# Patient Record
Sex: Male | Born: 1950 | Race: White | Hispanic: No | Marital: Married | Smoking: Never smoker
Health system: Southern US, Community
[De-identification: ages and names within clinical notes are randomized; demographics above are authoritative.]

## PROBLEM LIST (undated history)

## (undated) DIAGNOSIS — K635 Polyp of colon: Secondary | ICD-10-CM

## (undated) DIAGNOSIS — Z85828 Personal history of other malignant neoplasm of skin: Secondary | ICD-10-CM

## (undated) DIAGNOSIS — E079 Disorder of thyroid, unspecified: Secondary | ICD-10-CM

## (undated) DIAGNOSIS — E785 Hyperlipidemia, unspecified: Secondary | ICD-10-CM

## (undated) DIAGNOSIS — B009 Herpesviral infection, unspecified: Secondary | ICD-10-CM

## (undated) DIAGNOSIS — K5730 Diverticulosis of large intestine without perforation or abscess without bleeding: Secondary | ICD-10-CM

## (undated) DIAGNOSIS — E039 Hypothyroidism, unspecified: Secondary | ICD-10-CM

## (undated) DIAGNOSIS — H269 Unspecified cataract: Secondary | ICD-10-CM

## (undated) DIAGNOSIS — I10 Essential (primary) hypertension: Secondary | ICD-10-CM

## (undated) DIAGNOSIS — C801 Malignant (primary) neoplasm, unspecified: Secondary | ICD-10-CM

## (undated) HISTORY — PX: POLYPECTOMY: SHX149

## (undated) HISTORY — DX: Polyp of colon: K63.5

## (undated) HISTORY — DX: Hyperlipidemia, unspecified: E78.5

## (undated) HISTORY — PX: ACHILLES TENDON REPAIR: SUR1153

## (undated) HISTORY — DX: Personal history of other malignant neoplasm of skin: Z85.828

## (undated) HISTORY — DX: Malignant (primary) neoplasm, unspecified: C80.1

## (undated) HISTORY — PX: COLONOSCOPY: SHX174

## (undated) HISTORY — DX: Essential (primary) hypertension: I10

## (undated) HISTORY — PX: ANTERIOR CRUCIATE LIGAMENT REPAIR: SHX115

## (undated) HISTORY — PX: BASAL CELL CARCINOMA EXCISION: SHX1214

## (undated) HISTORY — PX: OTHER SURGICAL HISTORY: SHX169

## (undated) HISTORY — PX: EYE SURGERY: SHX253

## (undated) HISTORY — DX: Disorder of thyroid, unspecified: E07.9

## (undated) HISTORY — DX: Unspecified cataract: H26.9

---

## 2002-01-28 ENCOUNTER — Ambulatory Visit (HOSPITAL_COMMUNITY): Admission: RE | Admit: 2002-01-28 | Discharge: 2002-01-28 | Payer: Self-pay | Admitting: Internal Medicine

## 2002-01-28 ENCOUNTER — Encounter: Payer: Self-pay | Admitting: Internal Medicine

## 2003-08-19 ENCOUNTER — Ambulatory Visit (HOSPITAL_COMMUNITY): Admission: RE | Admit: 2003-08-19 | Discharge: 2003-08-19 | Payer: Self-pay | Admitting: Internal Medicine

## 2005-04-03 ENCOUNTER — Ambulatory Visit: Payer: Self-pay | Admitting: Gastroenterology

## 2005-04-16 ENCOUNTER — Ambulatory Visit: Payer: Self-pay | Admitting: Gastroenterology

## 2006-03-26 ENCOUNTER — Ambulatory Visit: Payer: Self-pay | Admitting: Family Medicine

## 2006-03-26 LAB — CONVERTED CEMR LAB
ALT: 26 units/L (ref 0–40)
AST: 33 units/L (ref 0–37)
Albumin: 4.1 g/dL (ref 3.5–5.2)
Alkaline Phosphatase: 60 units/L (ref 39–117)
BUN: 16 mg/dL (ref 6–23)
Basophils Absolute: 0 10*3/uL (ref 0.0–0.1)
Basophils Relative: 0.5 % (ref 0.0–1.0)
CO2: 31 meq/L (ref 19–32)
Calcium: 9.2 mg/dL (ref 8.4–10.5)
Chloride: 104 meq/L (ref 96–112)
Cholesterol: 156 mg/dL (ref 0–200)
Creatinine, Ser: 1.1 mg/dL (ref 0.4–1.5)
Eosinophils Absolute: 0.1 10*3/uL (ref 0.0–0.6)
Eosinophils Relative: 1.6 % (ref 0.0–5.0)
GFR calc Af Amer: 89 mL/min
GFR calc non Af Amer: 74 mL/min
Glucose, Bld: 109 mg/dL — ABNORMAL HIGH (ref 70–99)
HCT: 42.1 % (ref 39.0–52.0)
HDL: 59.2 mg/dL (ref >39.0)
Hemoglobin: 14.9 g/dL (ref 13.0–17.0)
Hgb A1c MFr Bld: 5.1 % (ref 4.6–6.0)
LDL Cholesterol: 83 mg/dL (ref 0–99)
Lymphocytes Relative: 33.7 % (ref 12.0–46.0)
MCHC: 35.3 g/dL (ref 30.0–36.0)
MCV: 95.7 fL (ref 78.0–100.0)
Monocytes Absolute: 0.3 10*3/uL (ref 0.2–0.7)
Monocytes Relative: 7.5 % (ref 3.0–11.0)
Neutro Abs: 2.6 10*3/uL (ref 1.4–7.7)
Neutrophils Relative %: 56.7 % (ref 43.0–77.0)
PSA: 1.47 ng/mL (ref 0.10–4.00)
Platelets: 215 10*3/uL (ref 150–400)
Potassium: 4.7 meq/L (ref 3.5–5.1)
RBC: 4.4 M/uL (ref 4.22–5.81)
RDW: 11.7 % (ref 11.5–14.6)
Sodium: 142 meq/L (ref 135–145)
TSH: 4.01 microintl units/mL (ref 0.35–5.50)
Total Bilirubin: 1.5 mg/dL — ABNORMAL HIGH (ref 0.3–1.2)
Total CHOL/HDL Ratio: 2.6
Total Protein: 7 g/dL (ref 6.0–8.3)
Triglycerides: 71 mg/dL (ref 0–149)
VLDL: 14 mg/dL (ref 0–40)
WBC: 4.5 10*3/uL (ref 4.5–10.5)

## 2006-12-06 DIAGNOSIS — Z86010 Personal history of colonic polyps: Secondary | ICD-10-CM

## 2006-12-06 DIAGNOSIS — Z85828 Personal history of other malignant neoplasm of skin: Secondary | ICD-10-CM

## 2006-12-06 DIAGNOSIS — E039 Hypothyroidism, unspecified: Secondary | ICD-10-CM | POA: Insufficient documentation

## 2006-12-06 DIAGNOSIS — I10 Essential (primary) hypertension: Secondary | ICD-10-CM | POA: Insufficient documentation

## 2007-03-28 ENCOUNTER — Ambulatory Visit: Payer: Self-pay | Admitting: Family Medicine

## 2007-03-28 LAB — CONVERTED CEMR LAB
ALT: 25 units/L (ref 0–53)
AST: 26 units/L (ref 0–37)
Albumin: 4.1 g/dL (ref 3.5–5.2)
Alkaline Phosphatase: 57 units/L (ref 39–117)
BUN: 13 mg/dL (ref 6–23)
Basophils Absolute: 0 10*3/uL (ref 0.0–0.1)
Basophils Relative: 0.2 % (ref 0.0–1.0)
Bilirubin Urine: NEGATIVE
Bilirubin, Direct: 0.1 mg/dL (ref 0.0–0.3)
Blood in Urine, dipstick: NEGATIVE
CO2: 30 meq/L (ref 19–32)
Calcium: 9 mg/dL (ref 8.4–10.5)
Chloride: 105 meq/L (ref 96–112)
Cholesterol: 140 mg/dL (ref 0–200)
Creatinine, Ser: 1.1 mg/dL (ref 0.4–1.5)
Eosinophils Absolute: 0.1 10*3/uL (ref 0.0–0.6)
Eosinophils Relative: 1.9 % (ref 0.0–5.0)
GFR calc Af Amer: 89 mL/min
GFR calc non Af Amer: 74 mL/min
Glucose, Bld: 109 mg/dL — ABNORMAL HIGH (ref 70–99)
Glucose, Urine, Semiquant: NEGATIVE
HCT: 41.9 % (ref 39.0–52.0)
HDL: 43.4 mg/dL (ref >39.0)
Hemoglobin: 14.9 g/dL (ref 13.0–17.0)
Ketones, urine, test strip: NEGATIVE
LDL Cholesterol: 87 mg/dL (ref 0–99)
Lymphocytes Relative: 41.4 % (ref 12.0–46.0)
MCHC: 35.5 g/dL (ref 30.0–36.0)
MCV: 95.8 fL (ref 78.0–100.0)
Monocytes Absolute: 0.4 10*3/uL (ref 0.2–0.7)
Monocytes Relative: 6.8 % (ref 3.0–11.0)
Neutro Abs: 2.7 10*3/uL (ref 1.4–7.7)
Neutrophils Relative %: 49.7 % (ref 43.0–77.0)
Nitrite: NEGATIVE
PSA: 1.43 ng/mL (ref 0.10–4.00)
Platelets: 228 10*3/uL (ref 150–400)
Potassium: 4.2 meq/L (ref 3.5–5.1)
Protein, U semiquant: NEGATIVE
RBC: 4.37 M/uL (ref 4.22–5.81)
RDW: 11.6 % (ref 11.5–14.6)
Sodium: 140 meq/L (ref 135–145)
Specific Gravity, Urine: 1.02
TSH: 6.68 microintl units/mL — ABNORMAL HIGH (ref 0.35–5.50)
Total Bilirubin: 0.7 mg/dL (ref 0.3–1.2)
Total CHOL/HDL Ratio: 3.2
Total Protein: 6.3 g/dL (ref 6.0–8.3)
Triglycerides: 46 mg/dL (ref 0–149)
Urobilinogen, UA: 0.2
VLDL: 9 mg/dL (ref 0–40)
WBC Urine, dipstick: NEGATIVE
WBC: 5.5 10*3/uL (ref 4.5–10.5)
pH: 7

## 2007-04-04 ENCOUNTER — Ambulatory Visit: Payer: Self-pay | Admitting: Family Medicine

## 2007-04-04 DIAGNOSIS — E785 Hyperlipidemia, unspecified: Secondary | ICD-10-CM

## 2007-04-04 DIAGNOSIS — M25559 Pain in unspecified hip: Secondary | ICD-10-CM

## 2007-04-08 ENCOUNTER — Ambulatory Visit: Payer: Self-pay | Admitting: Family Medicine

## 2007-04-14 ENCOUNTER — Telehealth: Payer: Self-pay | Admitting: Family Medicine

## 2007-04-15 ENCOUNTER — Telehealth: Payer: Self-pay | Admitting: Family Medicine

## 2008-03-31 ENCOUNTER — Ambulatory Visit: Payer: Self-pay | Admitting: Family Medicine

## 2008-03-31 LAB — CONVERTED CEMR LAB
ALT: 22 units/L (ref 0–53)
AST: 24 units/L (ref 0–37)
Albumin: 4 g/dL (ref 3.5–5.2)
Alkaline Phosphatase: 52 units/L (ref 39–117)
BUN: 15 mg/dL (ref 6–23)
Basophils Absolute: 0 10*3/uL (ref 0.0–0.1)
Basophils Relative: 0 % (ref 0.0–3.0)
Bilirubin Urine: NEGATIVE
Bilirubin, Direct: 0.1 mg/dL (ref 0.0–0.3)
Blood in Urine, dipstick: NEGATIVE
CO2: 30 meq/L (ref 19–32)
Calcium: 9.2 mg/dL (ref 8.4–10.5)
Chloride: 109 meq/L (ref 96–112)
Cholesterol: 149 mg/dL (ref 0–200)
Creatinine, Ser: 1 mg/dL (ref 0.4–1.5)
Eosinophils Absolute: 0.1 10*3/uL (ref 0.0–0.7)
Eosinophils Relative: 1.4 % (ref 0.0–5.0)
GFR calc Af Amer: 99 mL/min
GFR calc non Af Amer: 82 mL/min
Glucose, Bld: 107 mg/dL — ABNORMAL HIGH (ref 70–99)
Glucose, Urine, Semiquant: NEGATIVE
HCT: 42.5 % (ref 39.0–52.0)
HDL: 48.1 mg/dL (ref >39.0)
Hemoglobin: 14.8 g/dL (ref 13.0–17.0)
Ketones, urine, test strip: NEGATIVE
LDL Cholesterol: 87 mg/dL (ref 0–99)
Lymphocytes Relative: 39.2 % (ref 12.0–46.0)
MCHC: 34.8 g/dL (ref 30.0–36.0)
MCV: 96 fL (ref 78.0–100.0)
Monocytes Absolute: 0.3 10*3/uL (ref 0.1–1.0)
Monocytes Relative: 6.3 % (ref 3.0–12.0)
Neutro Abs: 2.8 10*3/uL (ref 1.4–7.7)
Neutrophils Relative %: 53.1 % (ref 43.0–77.0)
Nitrite: NEGATIVE
PSA: 1.41 ng/mL (ref 0.10–4.00)
Platelets: 224 10*3/uL (ref 150–400)
Potassium: 4.8 meq/L (ref 3.5–5.1)
RBC: 4.43 M/uL (ref 4.22–5.81)
RDW: 12.1 % (ref 11.5–14.6)
Sodium: 144 meq/L (ref 135–145)
Specific Gravity, Urine: 1.02
TSH: 3.03 microintl units/mL (ref 0.35–5.50)
Total Bilirubin: 1.1 mg/dL (ref 0.3–1.2)
Total CHOL/HDL Ratio: 3.1
Total Protein: 6.7 g/dL (ref 6.0–8.3)
Triglycerides: 71 mg/dL (ref 0–149)
Urobilinogen, UA: 0.2
VLDL: 14 mg/dL (ref 0–40)
WBC Urine, dipstick: NEGATIVE
WBC: 5.3 10*3/uL (ref 4.5–10.5)
pH: 6

## 2008-04-06 ENCOUNTER — Ambulatory Visit: Payer: Self-pay | Admitting: Family Medicine

## 2008-04-19 ENCOUNTER — Telehealth: Payer: Self-pay | Admitting: *Deleted

## 2009-04-01 ENCOUNTER — Ambulatory Visit: Payer: Self-pay | Admitting: Family Medicine

## 2009-04-01 LAB — CONVERTED CEMR LAB
ALT: 20 units/L (ref 0–53)
AST: 23 units/L (ref 0–37)
Albumin: 4.1 g/dL (ref 3.5–5.2)
Alkaline Phosphatase: 54 units/L (ref 39–117)
BUN: 15 mg/dL (ref 6–23)
Basophils Absolute: 0 10*3/uL (ref 0.0–0.1)
Basophils Relative: 0.6 % (ref 0.0–3.0)
Bilirubin Urine: NEGATIVE
Bilirubin, Direct: 0.2 mg/dL (ref 0.0–0.3)
CO2: 31 meq/L (ref 19–32)
Calcium: 9.1 mg/dL (ref 8.4–10.5)
Chloride: 106 meq/L (ref 96–112)
Cholesterol: 152 mg/dL (ref 0–200)
Creatinine, Ser: 1 mg/dL (ref 0.4–1.5)
Eosinophils Absolute: 0.1 10*3/uL (ref 0.0–0.7)
Eosinophils Relative: 1.9 % (ref 0.0–5.0)
GFR calc non Af Amer: 81.5 mL/min (ref >60)
Glucose, Bld: 102 mg/dL — ABNORMAL HIGH (ref 70–99)
HCT: 43.1 % (ref 39.0–52.0)
HDL: 52 mg/dL (ref >39.00)
Hemoglobin, Urine: NEGATIVE
Hemoglobin: 14.3 g/dL (ref 13.0–17.0)
Ketones, ur: NEGATIVE mg/dL
LDL Cholesterol: 83 mg/dL (ref 0–99)
Leukocytes, UA: NEGATIVE
Lymphocytes Relative: 43.8 % (ref 12.0–46.0)
Lymphs Abs: 2.1 10*3/uL (ref 0.7–4.0)
MCHC: 33.2 g/dL (ref 30.0–36.0)
MCV: 99.6 fL (ref 78.0–100.0)
Monocytes Absolute: 0.3 10*3/uL (ref 0.1–1.0)
Monocytes Relative: 6.5 % (ref 3.0–12.0)
Neutro Abs: 2.4 10*3/uL (ref 1.4–7.7)
Neutrophils Relative %: 47.2 % (ref 43.0–77.0)
Nitrite: NEGATIVE
PSA: 1.91 ng/mL (ref 0.10–4.00)
Platelets: 219 10*3/uL (ref 150.0–400.0)
Potassium: 5.1 meq/L (ref 3.5–5.1)
RBC: 4.33 M/uL (ref 4.22–5.81)
RDW: 11.8 % (ref 11.5–14.6)
Sodium: 141 meq/L (ref 135–145)
Specific Gravity, Urine: <=1.005 (ref 1.000–1.030)
TSH: 5.07 microintl units/mL (ref 0.35–5.50)
Total Bilirubin: 0.9 mg/dL (ref 0.3–1.2)
Total CHOL/HDL Ratio: 3
Total Protein, Urine: NEGATIVE mg/dL
Total Protein: 6.8 g/dL (ref 6.0–8.3)
Triglycerides: 84 mg/dL (ref 0.0–149.0)
Urine Glucose: NEGATIVE mg/dL
Urobilinogen, UA: 0.2 (ref 0.0–1.0)
VLDL: 16.8 mg/dL (ref 0.0–40.0)
WBC: 4.9 10*3/uL (ref 4.5–10.5)
pH: 6.5 (ref 5.0–8.0)

## 2009-04-14 ENCOUNTER — Ambulatory Visit: Payer: Self-pay | Admitting: Family Medicine

## 2009-04-14 ENCOUNTER — Telehealth: Payer: Self-pay | Admitting: Family Medicine

## 2010-04-04 NOTE — Progress Notes (Signed)
Summary: ? for the nurse  Phone Note Call from Patient Call back at Work Phone [phone]   Caller: Patient---live call Reason for Call: Talk to Nurse Summary of Call: Was seen this morning has ? for the nurse. Initial call taken by: Warnell Forester,  April 14, 2009 1:05 PM  Follow-up for Phone Call        left message on machine for patient to return our call Follow-up by: Kern Reap CMA Duncan Dull),  April 14, 2009 4:07 PM  Additional Follow-up for Phone Call Additional follow up Details #1::        Phone Call Completed Additional Follow-up by: Kern Reap CMA Duncan Dull),  April 14, 2009 4:13 PM

## 2010-04-04 NOTE — Assessment & Plan Note (Signed)
Summary: cpx/cjr   Vital Signs:  Patient profile:   60 year old male Height:      69.5 inches Weight:      200 pounds BMI:     29.22 Temp:     98.7 degrees F oral  Vitals Entered By: Kern Reap CMA Duncan Dull) (April 14, 2009 9:47 AM)  Reason for Visit cpx  History of Present Illness:  Alexander Griffith is a 60 year old, married man nonsmoker, who comes in today for evaluation of hypertension, hyperlipidemia, hypothyroidism, and occasional oral HSV.  His hypertension is treated with Benicar 20 mg daily will switch to Cozaar.  His hyperlipidemia history with Zocor 80 mg nightly lipids are goal with a total of 152, HDL 52, LDL 83.  His hypothyroidism is treated with Synthroid 100 micrograms daily TSH level 5.07 continue above dose.  He is an occasional outbreak of cold sores for which he takes acyclovir 400 mg t.i.d. p.r.n..  He gets routine eye care doctor.  Colonoscopy normal in GI, tetanus, 2010, seasonal flu 2010.  Allergies (verified): No Known Drug Allergies  Past History:  Past medical, surgical, family and social histories (including risk factors) reviewed, and no changes noted (except as noted below).  Past Medical History: Reviewed history from 12/06/2006 and no changes required. High Cholesterol Hypertension Hypothyroidism Skin cancer, hx of Colonic polyps, hx of  Past Surgical History: Reviewed history from 04/06/2008 and no changes required. Achilles Tendon Repair right ACL repair 09  Family History: Reviewed history from 12/06/2006 and no changes required. Family History of Colon CA 1st degree relative <60 Family History Hypertension Family History Lung cancer Family History of Stroke M 1st degree relative <50 Family History of Cardiovascular disorder  Social History: Reviewed history from 12/06/2006 and no changes required. Occupation: Married Never Smoked Alcohol use-yes Drug use-no Regular exercise-yes  Review of Systems      See HPI  Physical  Exam  General:  Well-developed,well-nourished,in no acute distress; alert,appropriate and cooperative throughout examination Head:  Normocephalic and atraumatic without obvious abnormalities. No apparent alopecia or balding. Eyes:  No corneal or conjunctival inflammation noted. EOMI. Perrla. Funduscopic exam benign, without hemorrhages, exudates or papilledema. Vision grossly normal. Ears:  External ear exam shows no significant lesions or deformities.  Otoscopic examination reveals clear canals, tympanic membranes are intact bilaterally without bulging, retraction, inflammation or discharge. Hearing is grossly normal bilaterally. Nose:  External nasal examination shows no deformity or inflammation. Nasal mucosa are pink and moist without lesions or exudates. Mouth:  Oral mucosa and oropharynx without lesions or exudates.  Teeth in good repair. Neck:  No deformities, masses, or tenderness noted. Chest Wall:  No deformities, masses, tenderness or gynecomastia noted. Breasts:  No masses or gynecomastia noted Lungs:  Normal respiratory effort, chest expands symmetrically. Lungs are clear to auscultation, no crackles or wheezes. Heart:  Normal rate and regular rhythm. S1 and S2 normal without gallop, murmur, click, rub or other extra sounds. Abdomen:  Bowel sounds positive,abdomen soft and non-tender without masses, organomegaly or hernias noted. Rectal:  No external abnormalities noted. Normal sphincter tone. No rectal masses or tenderness. Genitalia:  Testes bilaterally descended without nodularity, tenderness or masses. No scrotal masses or lesions. No penis lesions or urethral discharge. Prostate:  Prostate gland firm and smooth, no enlargement, nodularity, tenderness, mass, asymmetry or induration. Msk:  No deformity or scoliosis noted of thoracic or lumbar spine.   Pulses:  R and L carotid,radial,femoral,dorsalis pedis and posterior tibial pulses are full and equal bilaterally Extremities:  No  clubbing, cyanosis, edema, or deformity noted with normal full range of motion of all joints.   Neurologic:  No cranial nerve deficits noted. Station and gait are normal. Plantar reflexes are down-going bilaterally. DTRs are symmetrical throughout. Sensory, motor and coordinative functions appear intact. Skin:  Intact without suspicious lesions or rashes Cervical Nodes:  No lymphadenopathy noted Axillary Nodes:  No palpable lymphadenopathy Inguinal Nodes:  No significant adenopathy Psych:  Cognition and judgment appear intact. Alert and cooperative with normal attention span and concentration. No apparent delusions, illusions, hallucinations   Impression & Recommendations:  Problem # 1:  HYPERLIPIDEMIA (ICD-272.4) Assessment Improved  His updated medication list for this problem includes:    Zocor 80 Mg Tabs (Simvastatin) .[name]... Take 1 tab by mouth at bedtime  Orders: Prescription Created Electronically [phone]) EKG w/ Interpretation (93000)  Problem # 2:  HEALTH SCREENING (ICD-V70.0) Assessment: Unchanged  Orders: Prescription Created Electronically [phone])  Problem # 3:  HYPOTHYROIDISM (ICD-244.9) Assessment: Improved  His updated medication list for this problem includes:    Synthroid 100 Mcg Tabs (Levothyroxine sodium) .[name]... Take 1 tablet by mouth every morning  Orders: Prescription Created Electronically [phone]) EKG w/ Interpretation (93000)  Problem # 4:  HYPERTENSION (ICD-401.9) Assessment: Improved  The following medications were removed from the medication list:    Benicar 20 Mg Tabs (Olmesartan medoxomil) .[name]... Take 1 tablet by mouth once a day His updated medication list for this problem includes:    Cozaar 50 Mg Tabs (Losartan potassium) .[name]... Take 1 tablet by mouth every morning  Orders: Prescription Created Electronically [phone]) EKG w/ Interpretation (93000)  Complete Medication List: 1)  Adult Aspirin Low Strength 81 Mg Tbdp (Aspirin) 2)  Zocor 80 Mg  Tabs (Simvastatin) .... Take 1 tab by mouth at bedtime 3)  Synthroid 100 Mcg Tabs (Levothyroxine sodium) .... Take 1 tablet by mouth every morning 4)  Vitamin C 1000 Mg Tabs (Ascorbic acid) .... Take one tab once daily 5)  Glucosamine-chondroitin 1500-1200 Mg/6ml Liqd (Glucosamine-chondroitin) .... Once daily 6)  Biotin 300 Mcg Tabs (Biotin) .... Two times a day 7)  Co Q-10 30-5 Mg-unit Caps (Coenzyme q10-vitamin e) .... Once daily 8)  Acyclovir 400 Mg Tabs (Acyclovir) .... Take one tab three times a day 9)  Cozaar 50 Mg Tabs (Losartan potassium) .... Take 1 tablet by mouth every morning  Patient Instructions: 1)  Please schedule a follow-up appointment in 1 year. 2)  stop the Benicar, start Cozaar 50 mg daily. 3)  Check a blood pressure daily in the morning for the next 4 weeks to be sure.  Her blood pressure stays within normal range.......[name] 135/85 or less............ call if any problems occur Prescriptions: ACYCLOVIR 400 MG TABS (ACYCLOVIR) take one tab three times a day  #100 x 3   Entered and Authorized by:   Roderick Pee MD   Signed by:   Roderick Pee MD on 04/14/2009   Method used:   Electronically to        [facility]* (mail-order)             ,          Ph: [phone]       Fax: [phone]   RxID:   [id number] SYNTHROID 100 MCG  TABS (LEVOTHYROXINE SODIUM) Take 1 tablet by mouth every morning  #100 x 4   Entered and Authorized by:   Roderick Pee MD   Signed by:   Roderick Pee MD on 04/14/2009  Method used:   Electronically to        [facility]* (mail-order)             ,          Ph: [phone]       Fax: [phone]   RxID:   [id number] ZOCOR 80 MG  TABS (SIMVASTATIN) Take 1 tab by mouth at bedtime  #100 x 4   Entered and Authorized by:   Roderick Pee MD   Signed by:   Roderick Pee MD on 04/14/2009   Method used:   Electronically to        [facility]* (mail-order)             ,          Ph: [phone]       Fax:  [phone]   RxID:   [id number] COZAAR 50 MG TABS (LOSARTAN POTASSIUM) Take 1 tablet by mouth every morning  #100 x 3   Entered and Authorized by:   Roderick Pee MD   Signed by:   Roderick Pee MD on 04/14/2009   Method used:   Electronically to        [facility]* (mail-order)             ,          Ph: [phone]       Fax: [phone]   RxID:   [id number]    Immunization History:  Influenza Immunization History:    Influenza:  historical (12/03/2008)

## 2010-04-19 ENCOUNTER — Encounter: Payer: Self-pay | Admitting: Gastroenterology

## 2010-04-21 ENCOUNTER — Other Ambulatory Visit: Payer: Self-pay | Admitting: Family Medicine

## 2010-04-21 ENCOUNTER — Encounter (INDEPENDENT_AMBULATORY_CARE_PROVIDER_SITE_OTHER): Payer: Self-pay | Admitting: *Deleted

## 2010-04-21 ENCOUNTER — Other Ambulatory Visit: Payer: Self-pay

## 2010-04-21 DIAGNOSIS — Z0000 Encounter for general adult medical examination without abnormal findings: Secondary | ICD-10-CM

## 2010-04-21 LAB — BASIC METABOLIC PANEL
CO2: 29 mEq/L (ref 19–32)
Calcium: 8.7 mg/dL (ref 8.4–10.5)
GFR: 82.15 mL/min (ref >60.00)
Glucose, Bld: 100 mg/dL — ABNORMAL HIGH (ref 70–99)
Potassium: 5 mEq/L (ref 3.5–5.1)
Sodium: 140 mEq/L (ref 135–145)

## 2010-04-21 LAB — LIPID PANEL: Total CHOL/HDL Ratio: 3

## 2010-04-21 LAB — HEPATIC FUNCTION PANEL
AST: 30 U/L (ref 0–37)
Albumin: 3.8 g/dL (ref 3.5–5.2)
Alkaline Phosphatase: 62 U/L (ref 39–117)
Total Protein: 6.3 g/dL (ref 6.0–8.3)

## 2010-04-21 LAB — CBC WITH DIFFERENTIAL/PLATELET
Basophils Absolute: 0 10*3/uL (ref 0.0–0.1)
Basophils Relative: 0.3 % (ref 0.0–3.0)
HCT: 39.6 % (ref 39.0–52.0)
Hemoglobin: 14 g/dL (ref 13.0–17.0)
Lymphocytes Relative: 40.1 % (ref 12.0–46.0)
Lymphs Abs: 2.1 10*3/uL (ref 0.7–4.0)
MCHC: 35.4 g/dL (ref 30.0–36.0)
Monocytes Relative: 4.5 % (ref 3.0–12.0)
Neutro Abs: 2.8 10*3/uL (ref 1.4–7.7)
RBC: 4.1 Mil/uL — ABNORMAL LOW (ref 4.22–5.81)
RDW: 12.2 % (ref 11.5–14.6)

## 2010-04-21 LAB — URINALYSIS
Bilirubin Urine: NEGATIVE
Hgb urine dipstick: NEGATIVE
Ketones, ur: NEGATIVE
Leukocytes, UA: NEGATIVE
Urine Glucose: NEGATIVE
Urobilinogen, UA: 0.2 (ref 0.0–1.0)

## 2010-04-21 LAB — TSH: TSH: 5.55 u[IU]/mL — ABNORMAL HIGH (ref 0.35–5.50)

## 2010-04-26 NOTE — Letter (Signed)
Summary: Colonoscopy Letter  [facility]  [address]   [place], [place] [address]   Phone: [phone]  Fax: [phone]      April 19, 2010 MRN: [id number]   Alexander Griffith [address] [place], [place]  [address]   Dear Mr. RYNER,   According to your medical record, it is time for you to schedule a Colonoscopy. The American Cancer Society recommends this procedure as a method to detect early colon cancer. Patients with a family history of colon cancer, or a personal history of colon polyps or inflammatory bowel disease are at increased risk.  This letter has been generated based on the recommendations made at the time of your procedure. If you feel that in your particular situation this may no longer apply, please contact our office.  Please call our office at [phone] to schedule this appointment or to update your records at your earliest convenience.  Thank you for cooperating with [place] to provide you with the very best care possible.   Sincerely,   Sheryn Bison, M.D.  [facility] Gastroenterology Division [phone]

## 2010-05-03 ENCOUNTER — Encounter: Payer: Self-pay | Admitting: Family Medicine

## 2010-05-04 ENCOUNTER — Encounter (INDEPENDENT_AMBULATORY_CARE_PROVIDER_SITE_OTHER): Payer: Self-pay | Admitting: *Deleted

## 2010-05-04 ENCOUNTER — Encounter: Payer: Self-pay | Admitting: Family Medicine

## 2010-05-04 ENCOUNTER — Ambulatory Visit (INDEPENDENT_AMBULATORY_CARE_PROVIDER_SITE_OTHER): Payer: 59 | Admitting: Family Medicine

## 2010-05-04 DIAGNOSIS — B009 Herpesviral infection, unspecified: Secondary | ICD-10-CM

## 2010-05-04 DIAGNOSIS — E039 Hypothyroidism, unspecified: Secondary | ICD-10-CM

## 2010-05-04 DIAGNOSIS — I10 Essential (primary) hypertension: Secondary | ICD-10-CM

## 2010-05-04 DIAGNOSIS — E785 Hyperlipidemia, unspecified: Secondary | ICD-10-CM

## 2010-05-04 MED ORDER — LEVOTHYROXINE SODIUM 100 MCG PO TABS: 100.0000 ug | ORAL_TABLET | Freq: Every day | ORAL | Status: DC

## 2010-05-04 MED ORDER — SIMVASTATIN 20 MG PO TABS: 20.0000 mg | ORAL_TABLET | Freq: Every evening | ORAL | Status: DC

## 2010-05-04 MED ORDER — ACYCLOVIR 400 MG PO TABS: 400.0000 mg | ORAL_TABLET | ORAL | Status: DC

## 2010-05-04 NOTE — Patient Instructions (Signed)
Continue current medications.  Set up a time in the next couple weeks to remove the lesion under left shoulder.  Follow-up in GI for colonoscopy.  Annual exam in February 2013

## 2010-05-04 NOTE — Progress Notes (Signed)
  Subjective:    Patient ID: Alexander Griffith, male    DOB: September 30, 1950, 60 y.o.   MRN: [id number]  HPI Alexander Griffith is a delightful, 60 year old, married male, nonsmoker, who comes in today for annual physical examination because of a history of hyperlipidemia, hypertension, hypothyroidism.  His hyperlipidemia is treated with Zocor 20 mg nightly lipids are at goal.  His hypothyroidism is treated with Synthroid 100 mcg daily TSH normal.  Hypertension.  History with Cozaar 50 mg daily, along with aspirin tablet.  BP 130/80.  He also takes acyclovir 400 mg t.i.d., p.r.n.  Review of systems negative except for a red lesion on his left shoulder.  Advised to return for, removal.  He's had a history of skin cancers.  Also, is a history of colon polyps last colonoscopy 5 years ago, was normal.  He recently got a letter from Dr. Jarold Motto.  Advised to go back for follow-up colonoscopy since he said polyps and his family history is positive for colon cancer.   Review of Systems  Constitutional: Negative.   HENT: Negative.   Eyes: Negative.   Respiratory: Negative.   Cardiovascular: Negative.   Gastrointestinal: Negative.   Genitourinary: Negative.   Musculoskeletal: Negative.   Skin: Negative.   Neurological: Negative.   Hematological: Negative.   Psychiatric/Behavioral: Negative.        Objective:   Physical Exam  Constitutional: He is oriented to person, place, and time. He appears well-developed and well-nourished.  HENT:  Head: Normocephalic and atraumatic.  Right Ear: External ear normal.  Left Ear: External ear normal.  Nose: Nose normal.  Mouth/Throat: Oropharynx is clear and moist.  Eyes: Conjunctivae and EOM are normal. Pupils are equal, round, and reactive to light.  Neck: Normal range of motion. Neck supple. No JVD present. No tracheal deviation present. No thyromegaly present.  Cardiovascular: Normal rate, regular rhythm, normal heart sounds and intact distal pulses.  Exam reveals  no gallop and no friction rub.   No murmur heard. Pulmonary/Chest: Effort normal and breath sounds normal. No stridor. No respiratory distress. He has no wheezes. He has no rales. He exhibits no tenderness.  Abdominal: Soft. Bowel sounds are normal. He exhibits no distension and no mass. There is no tenderness. There is no rebound and no guarding.  Genitourinary: Rectum normal, prostate normal and penis normal. Guaiac negative stool. No penile tenderness.  Musculoskeletal: Normal range of motion. He exhibits no edema and no tenderness.  Lymphadenopathy:    He has no cervical adenopathy.  Neurological: He is alert and oriented to person, place, and time. He has normal reflexes. No cranial nerve deficit. He exhibits normal muscle tone.  Skin: Skin is warm and dry. No rash noted. No erythema. No pallor.  Psychiatric: He has a normal mood and affect. His behavior is normal. Judgment and thought content normal.          Assessment & Plan:  Hypertension continue Cozaar, 50 mg daily, and an aspirin tablet.  Hyperlipidemia.  Continue Zocor 20 mg daily.  Hypothyroidism.  Continue Synthroid 100 mcg daily.  Return sometime in the next couple weeks for removal of the red lesion on the left shoulder.  Also go ahead nature follow-up colonoscopy

## 2010-05-11 NOTE — Letter (Signed)
Summary: Pre Visit Letter Revised  [facility]  [address] [place], [place] [address]   Phone: [phone]  Fax: [phone]        05/04/2010 MRN: [id number] Alexander Griffith [address] [place], [place]  [address]  [place]             Procedure Date:  June 12, 2010   recall col Dr Jarold Motto   Welcome to the Gastroenterology Division at [facility].    You are scheduled to see a nurse for your pre-procedure visit on May 29, 2010 at 4:30 on the 3rd floor at [facility], [address]  We ask that you try to arrive at our office 15 minutes prior to your appointment time to allow for check-in.  Please take a minute to review the attached form.  If you answer "Yes" to one or more of the questions on the first page, we ask that you call the person listed at your earliest opportunity.  If you answer "No" to all of the questions, please complete the rest of the form and bring it to your appointment.    Your nurse visit will consist of discussing your medical and surgical history, your immediate family medical history, and your medications.   If you are unable to list all of your medications on the form, please bring the medication bottles to your appointment and we will list them.  We will need to be aware of both prescribed and over the counter drugs.  We will need to know exact dosage information as well.    Please be prepared to read and sign documents such as consent forms, a financial agreement, and acknowledgement forms.  If necessary, and with your consent, a friend or relative is welcome to sit-in on the nurse visit with you.  Please bring your insurance card so that we may make a copy of it.  If your insurance requires a referral to see a specialist, please bring your referral form from your primary care physician.  No co-pay is required for this nurse visit.     If you cannot keep your appointment, please call [phone] to cancel or reschedule  prior to your appointment date.  This allows [place] the opportunity to schedule an appointment for another patient in need of care.    Thank you for choosing [facility] for your medical needs.  We appreciate the opportunity to care for you.  Please visit [place] at our website  to learn more about our practice.  Sincerely, The Gastroenterology Division

## 2010-05-18 ENCOUNTER — Ambulatory Visit (INDEPENDENT_AMBULATORY_CARE_PROVIDER_SITE_OTHER): Payer: 59 | Admitting: Family Medicine

## 2010-05-18 ENCOUNTER — Encounter: Payer: Self-pay | Admitting: Family Medicine

## 2010-05-18 DIAGNOSIS — D239 Other benign neoplasm of skin, unspecified: Secondary | ICD-10-CM

## 2010-05-18 DIAGNOSIS — D229 Melanocytic nevi, unspecified: Secondary | ICD-10-CM

## 2010-05-18 NOTE — Progress Notes (Signed)
  Subjective:    Patient ID: Alexander Griffith, male    DOB: January 04, 1951, 60 y.o.   MRN: [id number]  HPI Alexander Griffith is a 60 year old male, who comes in today for removal of a lesion on his left shoulder.  He says that previous history of basal cell carcinomas.  The process was explained to the patient and after verbal consent, the lesion was anesthetized with 1% Xylocaine with epinephrine and the lesion was excised with 3-mm margins.  The base was cauterized.  Band-Aid was applied.  The lesion was sent for pathologic analysis.  Patient tolerated the procedure.  No complications   Review of Systems     Objective:   Physical Exam        Assessment & Plan:

## 2010-05-18 NOTE — Patient Instructions (Signed)
Remove the Band-Aid tomorrow sometime in the next two weeks Fleet Contras or I will call you with the report.  If within two weeks.  Neither of [place] call you you call me

## 2010-05-28 ENCOUNTER — Other Ambulatory Visit: Payer: Self-pay | Admitting: Family Medicine

## 2010-05-29 ENCOUNTER — Telehealth: Payer: Self-pay | Admitting: Family Medicine

## 2010-05-29 ENCOUNTER — Ambulatory Visit (AMBULATORY_SURGERY_CENTER): Payer: 59 | Admitting: *Deleted

## 2010-05-29 VITALS — Ht 71.0 in | Wt 199.6 lb

## 2010-05-29 DIAGNOSIS — Z86010 Personal history of colonic polyps: Secondary | ICD-10-CM

## 2010-05-29 MED ORDER — PEG-KCL-NACL-NASULF-NA ASC-C 100 G PO SOLR: 1.0000 | Freq: Once | ORAL | Status: AC

## 2010-05-29 NOTE — Telephone Encounter (Signed)
Pt called and said that he needs losartan (COZAAR) 50 MG tablet, sent to [facility] mail order today. Pt only has 5 days left. Pt says that if this isn't done today, he will need samples. Pls call pt.

## 2010-05-29 NOTE — Telephone Encounter (Signed)
rx sent

## 2010-05-30 NOTE — Telephone Encounter (Signed)
No samples available because this is generic.  Please let me know if he needs a 30 day visit

## 2010-05-30 NOTE — Telephone Encounter (Signed)
Pt called and is req samples of Losartan 50mg  until mail order arrives.

## 2010-06-01 NOTE — Telephone Encounter (Signed)
Pt has been informed.

## 2010-06-08 ENCOUNTER — Encounter: Payer: Self-pay | Admitting: *Deleted

## 2010-06-12 ENCOUNTER — Ambulatory Visit (AMBULATORY_SURGERY_CENTER): Payer: 59 | Admitting: Gastroenterology

## 2010-06-12 ENCOUNTER — Encounter: Payer: Self-pay | Admitting: Gastroenterology

## 2010-06-12 DIAGNOSIS — Z860100 Personal history of colon polyps, unspecified: Secondary | ICD-10-CM

## 2010-06-12 DIAGNOSIS — Z800 Family history of malignant neoplasm of digestive organs: Secondary | ICD-10-CM

## 2010-06-12 DIAGNOSIS — K5730 Diverticulosis of large intestine without perforation or abscess without bleeding: Secondary | ICD-10-CM

## 2010-06-12 DIAGNOSIS — Z1211 Encounter for screening for malignant neoplasm of colon: Secondary | ICD-10-CM

## 2010-06-12 MED ORDER — SODIUM CHLORIDE 0.9 % IV SOLN: 500.0000 mL | INTRAVENOUS | Status: DC

## 2010-06-12 NOTE — Patient Instructions (Signed)
Discharge instructions given with verbal understanding. Handout on Diverticulosis given. Instructions to resume previous medications. 

## 2010-06-13 ENCOUNTER — Telehealth: Payer: Self-pay | Admitting: *Deleted

## 2010-06-13 NOTE — Telephone Encounter (Signed)
Follow up Call- Patient questions:  Do you have a fever, pain , or abdominal swelling? no Pain Score  0 *  Have you tolerated food without any problems? yes  Have you been able to return to your normal activities? Yes   Do you have any questions about your discharge instructions: Diet   no Medications  no Follow up visit  no  Do you have questions or concerns about your Care? no  Actions: * If pain score is 4 or above: No action needed, pain <4.

## 2010-07-21 NOTE — Assessment & Plan Note (Signed)
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   PAST HOSPITALIZATIONS:  None. He had outpatient surgery of his left  Achilles tendon. Had a calcium deposit. He is a longtime runner. He had  scar revision from a laceration by Dr. Lewis Shock years ago.   PAST ILLNESSES:  None.   INJURIES:  None.   DRUG ALLERGIES:  None.   Does not smoke or drink any alcohol except for an occasional drink.   CURRENT MEDICATIONS:  1. Benicar 20 daily for hypertension.  2. Synthroid 75 mcg daily for hypothyroidism.  3. Zocor 80 mg one-fourth tablet q nightly for hyperlipidemia.  4. Valtrex t.i.d. p.r.n. for herpes.  5. Motrin 800 t.i.d. p.r.n. for muscle and joint pain.   REVIEW OF SYSTEMS:  He wears contacts for distance vision. He gets  yearly eye checks. He gets regular dental care. CARDIOPULMONARY:  Negative. GI: Negative. He has had colonoscopy x2, last was in 2007.  Negative. First one in 2004, showed some polyps, but last colonoscopy  was negative. 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   SOCIAL HISTORY:  He is originally from [place]. He went to  [facility] and then to [employer]. 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 Married. Three children. The  oldest child, who is a daughter, is 55 in good health. A 85 year old  nurse in good health, the second one and the third one is at [employer].   FAMILY HISTORY:  Dad died of lung cancer, myocardial infarction, was a  smoker. Died at age 62. Mom is  in her 51s and alive and well. Two  brothers, one who is older and has skin cancer. The younger one also has  skin cancer.   Last tetanus shot not known. Old records are pending.   PHYSICAL EXAMINATION:  Height 5 feet, 9-1/2 inches. Weight 189, blood  pressure 146/88, pulse is 60 and regular and he is afebrile.  In general, he is well-developed, well-nourished white male in no acute  distress. The patient does blood pressure at home and is 130/80.  HEAD, EYES, EARS, NOSE AND THROAT: Were negative.  NECK: Was supple. Thyroid was not enlarged. No carotid bruits.  CHEST: Clear to auscultation. __________ examination was negative.  ABDOMEN: Examination negative.  GENITALIA: Normal circumcised male.  RECTAL: Normal, stool guaiac negative. Prostate normal.  EXTREMITIES: Normal.  Skin and all peripheral pulses were normal.   LABORATORY DATA:  Shows a normal EKG. Other labs pending. Today he came  in fasting.   IMPRESSION:  1. High blood pressure with normal  blood pressures at home. Continue      Benicar 20 daily.  2. Hypothyroidism. Continue Synthroid as outlined. He takes one a day      a for 6 days and then tablet and a half on day 7.  3. Hyperlipidemia. Continue Zocor 80 mg. He cuts those in fourths.      Says his lipids have been normal in the past.   The patient is advised to maintain good health habits. He continues to  run 3 or 4 days a week. We switched his Valtrex to Zovirax 200 mg three  in the morning and 2 in the p.m. for oral herpetic outbreaks, because it  works just as well and is cheaper than Valtrex.     Alexander A. Tawanna Cooler, MD  Electronically Signed    JAT/Med[facility]  DD: 03/26/2006  DT: 03/26/2006  Job #: [id number]

## 2010-08-23 ENCOUNTER — Other Ambulatory Visit: Payer: Self-pay | Admitting: Family Medicine

## 2010-08-24 ENCOUNTER — Telehealth: Payer: Self-pay | Admitting: Family Medicine

## 2010-08-24 ENCOUNTER — Other Ambulatory Visit: Payer: Self-pay | Admitting: *Deleted

## 2010-08-24 NOTE — Telephone Encounter (Signed)
Refill Losartan to [facility] order.

## 2010-08-25 NOTE — Telephone Encounter (Signed)
rx already sent. 

## 2011-05-15 ENCOUNTER — Other Ambulatory Visit (INDEPENDENT_AMBULATORY_CARE_PROVIDER_SITE_OTHER): Payer: 59

## 2011-05-15 DIAGNOSIS — Z0000 Encounter for general adult medical examination without abnormal findings: Secondary | ICD-10-CM

## 2011-05-15 LAB — POCT URINALYSIS DIPSTICK
Bilirubin, UA: NEGATIVE
Blood, UA: NEGATIVE
Ketones, UA: NEGATIVE
Spec Grav, UA: 1.02
pH, UA: 6.5

## 2011-05-15 LAB — LIPID PANEL
Cholesterol: 152 mg/dL (ref 0–200)
HDL: 54.2 mg/dL (ref >39.00)
Triglycerides: 58 mg/dL (ref 0.0–149.0)

## 2011-05-15 LAB — CBC WITH DIFFERENTIAL/PLATELET
Basophils Absolute: 0 10*3/uL (ref 0.0–0.1)
Eosinophils Absolute: 0.1 10*3/uL (ref 0.0–0.7)
Hemoglobin: 13.7 g/dL (ref 13.0–17.0)
Lymphocytes Relative: 41.3 % (ref 12.0–46.0)
Lymphs Abs: 1.8 10*3/uL (ref 0.7–4.0)
MCHC: 34.2 g/dL (ref 30.0–36.0)
Monocytes Absolute: 0.3 10*3/uL (ref 0.1–1.0)
Neutro Abs: 2.2 10*3/uL (ref 1.4–7.7)
RDW: 12.9 % (ref 11.5–14.6)

## 2011-05-15 LAB — BASIC METABOLIC PANEL
CO2: 28 mEq/L (ref 19–32)
Calcium: 8.8 mg/dL (ref 8.4–10.5)
Glucose, Bld: 104 mg/dL — ABNORMAL HIGH (ref 70–99)
Sodium: 140 mEq/L (ref 135–145)

## 2011-05-15 LAB — HEPATIC FUNCTION PANEL
Albumin: 3.9 g/dL (ref 3.5–5.2)
Alkaline Phosphatase: 51 U/L (ref 39–117)

## 2011-05-15 LAB — TSH: TSH: 4.39 u[IU]/mL (ref 0.35–5.50)

## 2011-05-22 ENCOUNTER — Encounter: Payer: 59 | Admitting: Family Medicine

## 2011-05-29 ENCOUNTER — Ambulatory Visit (INDEPENDENT_AMBULATORY_CARE_PROVIDER_SITE_OTHER): Payer: 59 | Admitting: Family Medicine

## 2011-05-29 ENCOUNTER — Encounter: Payer: Self-pay | Admitting: Family Medicine

## 2011-05-29 VITALS — BP 120/80 | Temp 98.0°F | Ht 70.0 in | Wt 202.0 lb

## 2011-05-29 DIAGNOSIS — E785 Hyperlipidemia, unspecified: Secondary | ICD-10-CM

## 2011-05-29 DIAGNOSIS — Z85828 Personal history of other malignant neoplasm of skin: Secondary | ICD-10-CM

## 2011-05-29 DIAGNOSIS — I10 Essential (primary) hypertension: Secondary | ICD-10-CM

## 2011-05-29 DIAGNOSIS — B009 Herpesviral infection, unspecified: Secondary | ICD-10-CM

## 2011-05-29 DIAGNOSIS — E039 Hypothyroidism, unspecified: Secondary | ICD-10-CM

## 2011-05-29 MED ORDER — ACYCLOVIR 400 MG PO TABS: 400.0000 mg | ORAL_TABLET | ORAL | Status: DC

## 2011-05-29 MED ORDER — SIMVASTATIN 20 MG PO TABS: 20.0000 mg | ORAL_TABLET | Freq: Every evening | ORAL | Status: DC

## 2011-05-29 MED ORDER — LEVOTHYROXINE SODIUM 100 MCG PO TABS: 100.0000 ug | ORAL_TABLET | Freq: Every day | ORAL | Status: DC

## 2011-05-29 MED ORDER — LOSARTAN POTASSIUM 50 MG PO TABS: 50.0000 mg | ORAL_TABLET | Freq: Every day | ORAL | Status: DC

## 2011-05-29 NOTE — Progress Notes (Signed)
  Subjective:    Patient ID: JOANGEL VANOSDOL, male    DOB: [birth year], 61 y.o.   MRN: [id number]  HPIglen is a 61 year old married male nonsmoker who comes in today for general physical examination because of a history of hyperlipidemia, hypothyroidism, hypertension, and recurrent HSV  Medications are renewed and adjudicated  He states he feels well has no problems. He also history of a skin cancer removed   Review of Systems  Constitutional: Negative.   HENT: Negative.   Eyes: Negative.   Respiratory: Negative.   Cardiovascular: Negative.   Gastrointestinal: Negative.   Genitourinary: Negative.   Musculoskeletal: Negative.   Skin: Negative.   Neurological: Negative.   Hematological: Negative.   Psychiatric/Behavioral: Negative.        Objective:   Physical Exam  Constitutional: He is oriented to person, place, and time. He appears well-developed and well-nourished.  HENT:  Head: Normocephalic and atraumatic.  Right Ear: External ear normal.  Left Ear: External ear normal.  Nose: Nose normal.  Mouth/Throat: Oropharynx is clear and moist.  Eyes: Conjunctivae and EOM are normal. Pupils are equal, round, and reactive to light.  Neck: Normal range of motion. Neck supple. No JVD present. No tracheal deviation present. No thyromegaly present.  Cardiovascular: Normal rate, regular rhythm, normal heart sounds and intact distal pulses.  Exam reveals no gallop and no friction rub.   No murmur heard. Pulmonary/Chest: Effort normal and breath sounds normal. No stridor. No respiratory distress. He has no wheezes. He has no rales. He exhibits no tenderness.  Abdominal: Soft. Bowel sounds are normal. He exhibits no distension and no mass. There is no tenderness. There is no rebound and no guarding.  Genitourinary: Rectum normal, prostate normal and penis normal. Guaiac negative stool. No penile tenderness.  Musculoskeletal: Normal range of motion. He exhibits no edema and no tenderness.    Lymphadenopathy:    He has no cervical adenopathy.  Neurological: He is alert and oriented to person, place, and time. He has normal reflexes. No cranial nerve deficit. He exhibits normal muscle tone.  Skin: Skin is warm and dry. No rash noted. No erythema. No pallor.  Psychiatric: He has a normal mood and affect. His behavior is normal. Judgment and thought content normal.          Assessment & Plan:  Male  Hypertension continue Cozaar 50 mg daily  Hypothyroidism continue Synthroid 100 mcg daily  Hyperlipidemia continue simvastatin 20 mg daily with an aspirin tablet  Recurrent HSV acyclovir when necessary

## 2011-05-29 NOTE — Patient Instructions (Signed)
Continue your current medications and good health habits  Return one year sooner if any problems

## 2011-05-30 ENCOUNTER — Telehealth: Payer: Self-pay | Admitting: Family Medicine

## 2011-05-30 NOTE — Telephone Encounter (Signed)
Please schedule patient for Shingles Vaccine

## 2011-05-30 NOTE — Telephone Encounter (Signed)
Patient called stating that he was in the office on yesterday and upon calling insurance concerning the shingles shot he was informed that the MD office need to call to order the shot if not available in the office. Otherwise he is cleared to have the shot $0 out of pocket. Please advise.

## 2011-05-31 ENCOUNTER — Ambulatory Visit (INDEPENDENT_AMBULATORY_CARE_PROVIDER_SITE_OTHER): Payer: 59 | Admitting: Family Medicine

## 2011-05-31 DIAGNOSIS — Z2911 Encounter for prophylactic immunotherapy for respiratory syncytial virus (RSV): Secondary | ICD-10-CM

## 2011-05-31 DIAGNOSIS — Z0000 Encounter for general adult medical examination without abnormal findings: Secondary | ICD-10-CM

## 2011-10-15 ENCOUNTER — Encounter (HOSPITAL_COMMUNITY): Payer: Self-pay | Admitting: Emergency Medicine

## 2011-10-15 ENCOUNTER — Observation Stay (HOSPITAL_COMMUNITY): Payer: 59

## 2011-10-15 ENCOUNTER — Observation Stay (HOSPITAL_COMMUNITY)
Admission: EM | Admit: 2011-10-15 | Discharge: 2011-10-15 | Disposition: A | Discharge status: Home / Self Care | Payer: 59 | Attending: Emergency Medicine | Admitting: Emergency Medicine

## 2011-10-15 ENCOUNTER — Encounter: Payer: Self-pay | Admitting: Gastroenterology

## 2011-10-15 ENCOUNTER — Telehealth: Payer: Self-pay | Admitting: Family Medicine

## 2011-10-15 DIAGNOSIS — K922 Gastrointestinal hemorrhage, unspecified: Secondary | ICD-10-CM

## 2011-10-15 DIAGNOSIS — K5090 Crohn's disease, unspecified, without complications: Secondary | ICD-10-CM | POA: Insufficient documentation

## 2011-10-15 DIAGNOSIS — Z86010 Personal history of colonic polyps: Secondary | ICD-10-CM

## 2011-10-15 DIAGNOSIS — K625 Hemorrhage of anus and rectum: Principal | ICD-10-CM | POA: Insufficient documentation

## 2011-10-15 DIAGNOSIS — I10 Essential (primary) hypertension: Secondary | ICD-10-CM | POA: Insufficient documentation

## 2011-10-15 DIAGNOSIS — R109 Unspecified abdominal pain: Secondary | ICD-10-CM | POA: Insufficient documentation

## 2011-10-15 HISTORY — DX: Diverticulosis of large intestine without perforation or abscess without bleeding: K57.30

## 2011-10-15 HISTORY — DX: Herpesviral infection, unspecified: B00.9

## 2011-10-15 LAB — BASIC METABOLIC PANEL
BUN: 14 mg/dL (ref 6–23)
CO2: 27 mEq/L (ref 19–32)
Calcium: 8.9 mg/dL (ref 8.4–10.5)
Chloride: 101 mEq/L (ref 96–112)
Creatinine, Ser: 0.89 mg/dL (ref 0.50–1.35)
GFR calc Af Amer: >90 mL/min (ref >90)

## 2011-10-15 LAB — CBC WITH DIFFERENTIAL/PLATELET
Basophils Relative: 0 % (ref 0–1)
Eosinophils Relative: 0 % (ref 0–5)
HCT: 41.6 % (ref 39.0–52.0)
Hemoglobin: 15.1 g/dL (ref 13.0–17.0)
MCH: 33.3 pg (ref 26.0–34.0)
MCHC: 36.3 g/dL — ABNORMAL HIGH (ref 30.0–36.0)
MCV: 91.8 fL (ref 78.0–100.0)
Monocytes Absolute: 0.5 10*3/uL (ref 0.1–1.0)
Monocytes Relative: 4 % (ref 3–12)
Neutro Abs: 11.7 10*3/uL — ABNORMAL HIGH (ref 1.7–7.7)

## 2011-10-15 MED ORDER — IOHEXOL 300 MG/ML  SOLN
80.0000 mL | Freq: Once | INTRAMUSCULAR | Status: AC | PRN
  Administered 2011-10-15: 80 mL via INTRAVENOUS

## 2011-10-15 MED ORDER — METOCLOPRAMIDE HCL 5 MG/ML IJ SOLN
10.0000 mg | Freq: Once | INTRAMUSCULAR | Status: AC
  Administered 2011-10-15: 10 mg via INTRAVENOUS
  Filled 2011-10-15: qty 2

## 2011-10-15 MED ORDER — HYDROCODONE-ACETAMINOPHEN 5-325 MG PO TABS: 1.0000 | ORAL_TABLET | ORAL | Status: AC | PRN

## 2011-10-15 MED ORDER — HYDROCODONE-ACETAMINOPHEN 5-325 MG PO TABS
1.0000 | ORAL_TABLET | ORAL | Status: DC | PRN
  Administered 2011-10-15: 2 via ORAL
  Filled 2011-10-15: qty 2

## 2011-10-15 MED ORDER — HYDROMORPHONE HCL PF 1 MG/ML IJ SOLN
0.5000 mg | Freq: Once | INTRAMUSCULAR | Status: AC
  Administered 2011-10-15: 0.5 mg via INTRAVENOUS
  Filled 2011-10-15: qty 1

## 2011-10-15 MED ORDER — SODIUM CHLORIDE 0.9 % IV SOLN
INTRAVENOUS | Status: DC
  Administered 2011-10-15: 13:00:00 via INTRAVENOUS
  Filled 2011-10-15 (×4): qty 1000

## 2011-10-15 NOTE — ED Notes (Signed)
PT UP TO RESTROOM . PASSED GAS AND HAD A "SHOWERING APPEARANCE" OF RED BLOOD IN TOILET. SMALL AMOUNT.

## 2011-10-15 NOTE — ED Provider Notes (Signed)
9:59 AM Assumed care of patient in the CDU from Dr. Weldon Inches and Johnnette Gourd PA-C.  Patient presented today with a chief complaint of abdominal cramping and rectal bleeding.  GI has been consulted.  Patient is currently waiting for GI to come evaluate him.  Reassessed patient.  He reports that his pain has improved at this time.  No nausea or vomiting.  No active rectal bleeding.  VSS.  Patient alert and orientated x 3, Heart RRR, Lungs CTAB, Abdomen soft and nontender.  10:26 AM Discussed with Sarah from [facility].  She reports that she will come evaluate the patient in the CDU.  She states that it may be a little while before she is able to come evaluate the patient.  11:45 AM [facility] has evaluated the patient and will order CT with contrast to further evaluate rectal bleeding and abdominal pain.  1:57 PM Reassessed patient.  No complaints at this time.  Patient is finishing his oral contrast.  Abdominal pain tolerable.    3:00 PM Patient signed out to Juanita Laster, PA-C who will follow up on results of the CT.  Pascal Lux Council, PA-C 10/15/11 1542

## 2011-10-15 NOTE — ED Notes (Signed)
Pt has started his ct prep

## 2011-10-15 NOTE — ED Notes (Signed)
Alexander Griffith AT BEDSIDE BEGINNING PT EVAL

## 2011-10-15 NOTE — ED Provider Notes (Signed)
Medical screening examination/treatment/procedure(s) were conducted as a shared visit with non-physician practitioner(s) and myself.  I personally evaluated the patient during the encounter  Cheri Guppy, MD 10/15/11 1556

## 2011-10-15 NOTE — Telephone Encounter (Signed)
[facility] Triage Call Report Triage Record Num: [id number] Operator: Judeen Hammans Patient Name: Alexander Griffith Call Date & Time: 10/15/2011 7:24:19AM Patient Phone: [phone] PCP: Eugenio Hoes. Todd Patient Gender: Male PCP Fax : [phone] Patient DOB: [birth year] Practice Name: [facility] Reason for Call: Caller: Pam/Spouse; PCP: Roderick Pee.; CB#: [phone]; Call regarding large amount of bright red blood coming from rectum. Patient is also having unbearable abdominal pain beneath belly button. All emergent symptoms ruled out per Gastrointestinal Bleeding Guideline except for "Unbearable abdominal or back pain." Advised spouse to hang up and call 911 per triage disposition. Spouse agrees to advice. Care advice given. Protocol(s) Used: Gastrointestinal Bleeding Recommended Outcome per Protocol: Activate EMS 911 Reason for Outcome: Unbearable abdominal or back pain (localized; generalized; deep, boring or tearing) Care Advice: ~ Do not give the patient anything to eat or drink. ~ An adult should stay with the patient, preferably one trained in CPR. ~ IMMEDIATE ACTION Write down provider's name. List or place the following in a bag for transport with the patient: current prescription and/or nonprescription medications; alternative treatments, therapies and medications; and street drugs. ~ 08/

## 2011-10-15 NOTE — Consult Note (Signed)
Patient seen, examined, and I agree with the above documentation, including the assessment and plan. I expect acute ischemic colitis, but agree with CT to rule out other pathology. IVFs and pain control. NPO for now, can likely advance to clears after CT scan He is up to date on colonoscopy from a screening/surveillance standpoint, and I do not think he needs one urgently. Furthers recs after CT

## 2011-10-15 NOTE — ED Notes (Signed)
[facility] CALLED.

## 2011-10-15 NOTE — ED Provider Notes (Signed)
Medical screening examination/treatment/procedure(s) were conducted as a shared visit with non-physician practitioner(s) and myself.  I personally evaluated the patient during the encounter  Shalia Bartko, MD 10/15/11 1556 

## 2011-10-15 NOTE — ED Notes (Signed)
PT HAS ARRIVED IN CDU FROM POD A

## 2011-10-15 NOTE — ED Provider Notes (Signed)
CT scan shows segmental colitis. GI Margarette Asal, PA) in CDU to review CT. Recommends d/c home, pain medications and GI follow this week.     Rodena Medin, PA-C 10/15/11 1545

## 2011-10-15 NOTE — ED Notes (Addendum)
Pt c/o abdominal pain onset yesterday. Pt also reports bright red blood in stool yesterday. Family reports large amount of bright red blood on bed today. Pt denies having BM today just last night. Pt denies n/v.

## 2011-10-15 NOTE — ED Provider Notes (Signed)
Medical screening examination/treatment/procedure(s) were conducted as a shared visit with non-physician practitioner(s) and myself.  I personally evaluated the patient during the encounter Ate peanuts last night  Got abd discomfort.  Later BRBPR.  Increasing amt.   No light headedness or sob.   abd benign.   Will check labs and speak to pcp.  He asked for gi consult.  Cheri Guppy, MD 10/15/11 1556

## 2011-10-15 NOTE — Progress Notes (Signed)
Observation review is complete. 

## 2011-10-15 NOTE — Consult Note (Signed)
[facility] Consult: 10:37 AM 10/15/2011   Referring Provider: ED MD  Dr Weldon Inches.  Primary Care Physician:  Evette Georges, MD Primary Gastroenterologist:  Dr. Sheryn Bison   Reason for Consultation:  Hematochezia with abdominal pain.  HPI: Alexander Griffith is a 61 y.o. male.  Hx htn. Hypothyroidism. Serial colonoscopies in past, latest 06/2010. For hx of adenomatous colon polyps in 1990s.  No recurrent polyps on 06/2010 or the previous colonoscopy.  Did have sigmoid diverticulosis.  Has never had GI bleed or diverticulitis.  Was in [facility] yesterday AM.  3 PM ate peanuts, then developed increased belching and abdominal discomfort, mostly in lower abdomen.  Was unsuccesful in passing BM but passed flatus.  Took Tums, later when pain was worse, he took Catering [employer] at [employer].  At 9PM had profuse sweating, felt light headed, had a brown stool mixed with blood, occurred twice over 1/2 hour.  Pain reached 8/10 at worst.  Still sweating a lot, restless sleep overnight.  Early this AM around 7, passed ~ 20 CC of blood when he thought he was just passing flatus, he found the blood on bed sheet.  Pain not relieved by PO or IV Reglan, did relieve with 0.5 mg IV Dilaudid in ED.  No nausea but appetite diminished.   In ED the Hgb is 15.1, the WBC is 13.3. Blood glucose is 134, no hx of glucose intolerance.  Denies recent dehydration or strenuous activity.  No new BP meds or dose adjustments. Takes an 81 mg ASA daily Still belching when he takes PO water.   Past Medical History  Diagnosis Date  . Hyperlipidemia   . Hypertension   . Thyroid disease   . Colon polyps   . Cancer     skin  . HSV infection     Past Surgical History  Procedure Date  . Achilles tendon repair   . Anterior cruciate ligament repair     right, 09  . Polypectomy   . Colonoscopy   . Basal cell carcinoma excision     05/2010    Prior to Admission medications   Medication  Sig Start Date End Date Taking? Authorizing Provider  Ascorbic Acid (VITAMIN C) 100 MG tablet Take 100 mg by mouth daily.     Yes Historical Provider, MD  aspirin 81 MG tablet Take 81 mg by mouth daily.     Yes Historical Provider, MD  Biotin 300 MCG TABS Take by mouth 2 (two) times daily.     Yes Historical Provider, MD  co-enzyme Q-10 30 MG capsule Take 30 mg by mouth daily.     Yes Historical Provider, MD  fish oil-omega-3 fatty acids 1000 MG capsule Take 1 g by mouth daily.   Yes Historical Provider, MD  glucosamine-chondroitin 500-400 MG tablet Take 2 tablets by mouth daily.    Yes Historical Provider, MD  levothyroxine (SYNTHROID, LEVOTHROID) 100 MCG tablet Take 1 tablet (100 mcg total) by mouth daily. 05/29/11  Yes Roderick Pee, MD  losartan (COZAAR) 50 MG tablet Take 1 tablet (50 mg total) by mouth daily. 05/29/11  Yes Roderick Pee, MD  simvastatin (ZOCOR) 20 MG tablet Take 1 tablet (20 mg total) by mouth every evening. 05/29/11  Yes Roderick Pee, MD  simvastatin (ZOCOR) 20 MG tablet Take 1 tablet (20 mg total) by mouth every evening. 05/04/10 05/04/11  Roderick Pee, MD    Scheduled Meds:    .  HYDROmorphone (DILAUDID) injection  0.5 mg  Intravenous Once  . metoCLOPramide (REGLAN) injection  10 mg Intravenous Once   Infusions:    . sodium chloride     PRN Meds:    Allergies as of 10/15/2011  . (No Known Allergies)    Family History  Problem Relation Age of Onset  . Hypertension Other   . Cancer Other     colon,lung  . Stroke Other   . Heart disease Other   . Colon cancer Paternal Grandfather     History   Social History  . Marital Status: Married    Spouse Name: N/A    Number of Children: N/A  . Years of Education: N/A   Occupational History  . Not on file.   Social History Main Topics  . Smoking status: Never Smoker   . Smokeless tobacco: Not on file  . Alcohol Use: Yes     occ. social alcohol  . Drug Use: No  . Sexually Active:     REVIEW OF  SYSTEMS: No headachess  no chest pain, no palpitations.  No SOB or cough. No nose bleeds No hematuria, dysuria, frequency.   Generally active, no exercise limits. No pedal edema Infrequent use of NSAIDs and Alka Seltzer. No falls or gait problems.  No memory problems.  Non smoker.  Was in [place] in May [place], never had GI sxs during or after the trip   PHYSICAL EXAM: Vital signs in last 24 hours: Temp:  [97 F (36.1 C)] 97 F (36.1 C) (08/12 0805) Pulse Rate:  [68] 68  (08/12 0805) Resp:  [18] 18  (08/12 0805) BP: (147)/(87) 147/87 mmHg (08/12 0805) SpO2:  [100 %] 100 % (08/12 0805)  General: looks well.  No distress Head:  No asymmetry or facial swelling  Eyes:  No pallor or icterus Ears:  Not HOH  Nose:  No discharge or congestion. Mouth:  Pink, clear , moist oral MM Neck:  No JVD or mass Lungs:  Clear.  Not SOB or coughnig Heart: RRR.  No MRG Abdomen:  Soft, NT, ND, active BS.[name]   Rectal: no mass, red blood on glove   Musc/Skeltl: no joint swelling or deformity Extremities:  No pedal edema or cyanosis  Neurologic:  A & O x 3.   Skin:  No rash or sores Tattoos:  none Nodes:  No adenopathy at groin   Psych:  Pleasant, not depressed or anxious.   LAB RESULTS:  Basename 10/15/11 0814  WBC 13.3*  HGB 15.1  HCT 41.6  PLT 228   Hgb            13.7   On 05/15/11.   BMET Lab Results  Component Value Date   NA 138 10/15/2011   NA 140 05/15/2011   NA 140 04/21/2010   K 3.9 10/15/2011   K 4.6 05/15/2011   K 5.0 04/21/2010   CL 101 10/15/2011   CL 105 05/15/2011   CL 105 04/21/2010   CO2 27 10/15/2011   CO2 28 05/15/2011   CO2 29 04/21/2010   GLUCOSE 134* 10/15/2011   GLUCOSE 104* 05/15/2011   GLUCOSE 100* 04/21/2010   BUN 14 10/15/2011   BUN 15 05/15/2011   BUN 13 04/21/2010   CREATININE 0.89 10/15/2011   CREATININE 1.0 05/15/2011   CREATININE 1.0 04/21/2010   CALCIUM 8.9 10/15/2011   CALCIUM 8.8 05/15/2011   CALCIUM 8.7 04/21/2010   PT/INR No results found for  this basename: INR,  PROTIME   ENDOSCOPIC STUDIES: 06/2010 Colonoscopy  For hx adenomatous polyps and positive FHX colon CA Sigmoid diverticulosis.   IMPRESSION: *  Acute hematochezia with abdominal pain.  Given degree of pain, suspect acute colitis rather than diverticular bleed.  Hemodynamically stable.  No anemia. But the H & H are higher than baseline so may be   Pt with documented diverticulosis on colonoscopy.  Not  Clear that his eating peanuts earlier in the day has anything to do with the acute illness.  *  Hx adenomatous colon polyps, no recurrence on Colon of 06/2010   PLAN: *  Give IVF :  Ns with 10 KCL at 125 ml/hour.   *  Prn hydrocodone 5/325 mg *  CT scan of abdomen/pelvis with contrast, assess for colitis *  If pain controlled with vicodin and no ominous CT findings, could very weel d/c to convalesce at home. Case was d/w Dr Christella Hartigan.    LOS: 0 days   Jennye Moccasin  10/15/2011, 10:37 AM Pager: [phone]

## 2011-10-15 NOTE — ED Provider Notes (Signed)
History     CSN: [id number]  Arrival date & time 10/15/11  0756   First MD Initiated Contact with Patient 10/15/11 0759      Chief Complaint  Patient presents with  . Rectal Bleeding  . Abdominal Pain    (Consider location/radiation/quality/duration/timing/severity/associated sxs/prior treatment) HPI Comments: 61 y/o male presents with his wife with lower abdominal cramping and rectal bleeding since last night. States he ate some salty peanuts before dinner and began to not feel right. He began having cramping abdominal pain rated 8/10 with associated cold sweats. He lost his appetite, became a little nauseated and did not eat dinner. A little later he went to the bathroom and began having a bowel movement that changed to diarrhea with bright red blood. He had a couple episodes of this last night. States he was tossing and turning throughout the night to get comfortable. When he woke up this morning his wife noticed a pool of blood on the sheets where his rectum was. Abdominal pain today about 6/10 still described as cramping and nawing. He is feeling fatigued due to lack of sleep. This has never happened to him before. No history of abdominal surgery, GI diseases. Denies any urinary symptoms, lightheadedness, dizziness, chest pain, sob, heartburn. Positive family history of colon cancer in his grandfather. He is a non smoker. Takes aspirin daily.  Patient is a 61 y.o. male presenting with hematochezia and abdominal pain. The history is provided by the patient and the spouse.  Rectal Bleeding  Associated symptoms include abdominal pain, diarrhea and nausea. Pertinent negatives include no vomiting and no chest pain.  Abdominal Pain The primary symptoms of the illness include abdominal pain, fatigue, nausea, diarrhea and hematochezia. The primary symptoms of the illness do not include shortness of breath or vomiting.  Additional symptoms associated with the illness include chills and  diaphoresis.    Past Medical History  Diagnosis Date  . Hyperlipidemia   . Hypertension   . Thyroid disease   . Colon polyps   . Cancer     skin    Past Surgical History  Procedure Date  . Achilles tendon repair   . Anterior cruciate ligament repair     right, 09  . Polypectomy   . Colonoscopy     Family History  Problem Relation Age of Onset  . Hypertension Other   . Cancer Other     colon,lung  . Stroke Other   . Heart disease Other   . Colon cancer Paternal Grandfather     History  Substance Use Topics  . Smoking status: Never Smoker   . Smokeless tobacco: Not on file  . Alcohol Use: Yes     occ. social alcohol      Review of Systems  Constitutional: Positive for chills, diaphoresis and fatigue.  Respiratory: Negative for shortness of breath.   Cardiovascular: Negative for chest pain.  Gastrointestinal: Positive for nausea, abdominal pain, diarrhea, blood in stool and hematochezia. Negative for vomiting.  Genitourinary:       Negative for urinary complaints  Neurological: Negative for dizziness and light-headedness.    Allergies  Review of patient's allergies indicates no known allergies.  Home Medications   Current Outpatient Rx  Name Route Sig Dispense Refill  . VITAMIN C 100 MG PO TABS Oral Take 100 mg by mouth daily.      . ASPIRIN 81 MG PO TABS Oral Take 81 mg by mouth daily.      [name] BIOTIN 300  MCG PO TABS Oral Take by mouth 2 (two) times daily.      [name] COENZYME Q10 30 MG PO CAPS Oral Take 30 mg by mouth daily.      . OMEGA-3 FATTY ACIDS 1000 MG PO CAPS Oral Take 1 g by mouth daily.    [name] GLUCOSAMINE-CHONDROITIN 500-400 MG PO TABS Oral Take 2 tablets by mouth daily.     [name] LEVOTHYROXINE SODIUM 100 MCG PO TABS Oral Take 1 tablet (100 mcg total) by mouth daily. 100 tablet 3  . LOSARTAN POTASSIUM 50 MG PO TABS Oral Take 1 tablet (50 mg total) by mouth daily. 100 tablet 3  . SIMVASTATIN 20 MG PO TABS Oral Take 1 tablet (20 mg total) by mouth every  evening. 100 tablet 3  . SIMVASTATIN 20 MG PO TABS Oral Take 1 tablet (20 mg total) by mouth every evening. 100 tablet 3    BP 147/87  Pulse 68  Temp 97 F (36.1 C) (Oral)  Resp 18  SpO2 100%  Physical Exam  Constitutional: He is oriented to person, place, and time. He appears well-developed and well-nourished. No distress.  HENT:  Head: Normocephalic and atraumatic.  Mouth/Throat: Oropharynx is clear and moist.  Eyes: Conjunctivae are normal.  Neck: Neck supple.  Cardiovascular: Normal rate, regular rhythm, normal heart sounds and intact distal pulses.        Good capillary refill  Pulmonary/Chest: Effort normal and breath sounds normal.  Genitourinary: Rectal exam shows no external hemorrhoid, no internal hemorrhoid, no fissure, no mass and no tenderness. Guaiac positive stool.  Neurological: He is alert and oriented to person, place, and time.  Skin: Skin is warm and dry. No rash noted. He is not diaphoretic. No pallor.  Psychiatric: He has a normal mood and affect. His speech is normal and behavior is normal.    ED Course  Procedures (including critical care time)  Labs Reviewed  CBC WITH DIFFERENTIAL - Abnormal; Notable for the following:    WBC 13.3 (*)     MCHC 36.3 (*)     Neutrophils Relative 88 (*)     Neutro Abs 11.7 (*)     Lymphocytes Relative 8 (*)     All other components within normal limits  OCCULT BLOOD X 1 CARD TO LAB, STOOL  BASIC METABOLIC PANEL   Results for orders placed during the hospital encounter of 10/15/11  CBC WITH DIFFERENTIAL      Component Value Range   WBC 13.3 (*) 4.0 - 10.5 K/uL   RBC 4.53  4.22 - 5.81 MIL/uL   Hemoglobin 15.1  13.0 - 17.0 g/dL   HCT 45.4  09.8 - 11.9 %   MCV 91.8  78.0 - 100.0 fL   MCH 33.3  26.0 - 34.0 pg   MCHC 36.3 (*) 30.0 - 36.0 g/dL   RDW 14.7  82.9 - 56.2 %   Platelets 228  150 - 400 K/uL   Neutrophils Relative 88 (*) 43 - 77 %   Neutro Abs 11.7 (*) 1.7 - 7.7 K/uL   Lymphocytes Relative 8 (*) 12 - 46 %    Lymphs Abs 1.1  0.7 - 4.0 K/uL   Monocytes Relative 4  3 - 12 %   Monocytes Absolute 0.5  0.1 - 1.0 K/uL   Eosinophils Relative 0  0 - 5 %   Eosinophils Absolute 0.0  0.0 - 0.7 K/uL   Basophils Relative 0  0 - 1 %   Basophils Absolute 0.0  0.0 - 0.1 K/uL  BASIC METABOLIC PANEL      Component Value Range   Sodium 138  135 - 145 mEq/L   Potassium 3.9  3.5 - 5.1 mEq/L   Chloride 101  96 - 112 mEq/L   CO2 27  19 - 32 mEq/L   Glucose, Bld 134 (*) 70 - 99 mg/dL   BUN 14  6 - 23 mg/dL   Creatinine, Ser 1.61  0.50 - 1.35 mg/dL   Calcium 8.9  8.4 - 09.6 mg/dL   GFR calc non Af Amer >90  >90 mL/min   GFR calc Af Amer >90  >90 mL/min  OCCULT BLOOD, POC DEVICE      Component Value Range   Fecal Occult Bld POSITIVE      No results found.   1. GI bleed   2. Abdominal pain       MDM  61 y/o male with abdominal cramping and rectal bleeding since yesterday. Occult blood positive. Concern for diverticular bleed, av malformation, mass. Spoke with Dr. Tawanna Cooler who would like patient evaluated in ED by GI. Case discussed with Dr. Weldon Inches who also evaluated patient and agrees with plan of care. Consult placed to GI. 9:29 AM Spoke with GI who will see him in a few hours. Patient will be moved to CDU. Case discussed with Doran Durand, PA-C in CDU who will monitor patient until GI consults.       Trevor Mace, PA-C 10/15/11 [phone]

## 2011-10-16 ENCOUNTER — Telehealth: Payer: Self-pay | Admitting: Gastroenterology
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
## 2011-10-16 NOTE — Telephone Encounter (Signed)
I recommend liquid diet only for now, until pain improves.  At this point, I do not think he needs antibiotics given this is felt to be ischemic injury to the left colon. Pepcid 10-20 mg BID is an acceptable alternative to PPI at this point Monitor fever and pain.  If he worsens he may need to be admitted. Recheck on him tomorrow. Thanks

## 2011-10-16 NOTE — Telephone Encounter (Signed)
Pt seen by Jennye Moccasin, PA and Dr Rhea Belton in [facility] ER yesterday. Wife reports CT scan confirmed Ischemic Colitis. Pt was discharged home to rest, increase fluids, follow BRAT diet and let the colitis heal itself; he was given pain meds. When pt starts to drink, the pain comes back, he belches a lot and has hiccups. He passes a lot of gas and a few minutes ago, he passed gas and passed blood with it. His temp is 100 and his last BP was 130/72. Wife wants to know if 1) pt needs an antibiotic since he's running a fever and 2) would a PPI help- she has generic Pepcid on hand? Please advise. Thanks.

## 2011-10-17 ENCOUNTER — Telehealth: Payer: Self-pay | Admitting: Gastroenterology

## 2011-10-17 DIAGNOSIS — K529 Noninfective gastroenteritis and colitis, unspecified: Secondary | ICD-10-CM

## 2011-10-17 DIAGNOSIS — R140 Abdominal distension (gaseous): Secondary | ICD-10-CM

## 2011-10-17 DIAGNOSIS — R109 Unspecified abdominal pain: Secondary | ICD-10-CM

## 2011-10-17 NOTE — Telephone Encounter (Signed)
See encounter for 10/17/11

## 2011-10-17 NOTE — Telephone Encounter (Signed)
Informed pt's wife of Dr Lauro Franklin recommendations/instructions. Pt will come tomorrow for his xray. Pt denies heartburn so he will remain on the Pepcid bid. Wife reports pt's temp is hanging around 99. Pt wants more to eat even he belches afterwards, but he was instructed to remain on a bland diet as well as the [employer]

## 2011-10-17 NOTE — Telephone Encounter (Signed)
Update on pt per wife. Last pm around 6:00, pt had a temp of 101; he took a tepid shower and temp decreased to 99.4. At 7pm, the temp went back up to 100 and at 10pm, pt took pain meds and went to bed. Pt's temp is normal this am. He took Pepcid as ordered, is drinking water and ate a banana. He remains bloated with increased belching and passes gas a lot, but no BM.; there is no blood when gas is passed today. At 0940am today, pt ate applesauce per [employer]. Wife was concerned that pt had a blockage or "leakage", but I read over the CT report to her and she was satisfied. Wife would like to know: 1) should pt be on a stronger PPI, 2) how can pt advance his diet- what foods? Thanks.

## 2011-10-17 NOTE — Telephone Encounter (Signed)
I would continue BRAT diet for now.  Bland foods only, encourage hydration.  Only use narcotic pain meds if needed.  Avoid NSAIDs 2 view abd xray tomorrow I am okay with PPI (pantoprazole 40 mg daily), but only if he is having breakthrough indigestion/heartburn when using BID pepcid. If no symptoms, then continue BID pepcid. Thanks

## 2011-10-17 NOTE — Addendum Note (Signed)
Addended by: Florene Kaceton on: 10/17/2011 04:53 PM   Modules accepted: Orders

## 2011-10-18 ENCOUNTER — Telehealth: Payer: Self-pay | Admitting: Gastroenterology

## 2011-10-18 ENCOUNTER — Ambulatory Visit (INDEPENDENT_AMBULATORY_CARE_PROVIDER_SITE_OTHER)
Admission: RE | Admit: 2011-10-18 | Discharge: 2011-10-18 | Disposition: A | Discharge status: Home / Self Care | Payer: 59 | Source: Ambulatory Visit | Attending: Internal Medicine | Admitting: Internal Medicine

## 2011-10-18 DIAGNOSIS — R140 Abdominal distension (gaseous): Secondary | ICD-10-CM

## 2011-10-18 DIAGNOSIS — K5289 Other specified noninfective gastroenteritis and colitis: Secondary | ICD-10-CM

## 2011-10-18 DIAGNOSIS — R141 Gas pain: Secondary | ICD-10-CM

## 2011-10-18 DIAGNOSIS — K529 Noninfective gastroenteritis and colitis, unspecified: Secondary | ICD-10-CM

## 2011-10-18 DIAGNOSIS — R109 Unspecified abdominal pain: Secondary | ICD-10-CM

## 2011-10-18 NOTE — Telephone Encounter (Signed)
Informed wife of normal bowel per xray. He should f/u with Dr Jarold Motto in the next 2 weeks; pt has an appt on 11/06/11. Wife wanted to know how to advance pt's diet and I explained for pt not to eat anything heavy or hard to digest. Baked chix would be fine, but not fried. Pasta is OK , but no Alfredo sause. Dr Rhea Belton, there was contrast in the colon from Ct scan and pt hasn't had a BM since the CT on 10/15/11; should pt take a laxative or is the gut still too tender? Thanks.

## 2011-10-18 NOTE — Telephone Encounter (Signed)
Informed pt's wife Dr Rhea Belton stated pt may take Miralax 17grams daily prn for constipation and may take 2 doses today if he chooses; wife stated understanding.

## 2011-11-06 ENCOUNTER — Encounter: Payer: Self-pay | Admitting: Gastroenterology

## 2011-11-06 ENCOUNTER — Other Ambulatory Visit (INDEPENDENT_AMBULATORY_CARE_PROVIDER_SITE_OTHER): Payer: 59

## 2011-11-06 ENCOUNTER — Ambulatory Visit (INDEPENDENT_AMBULATORY_CARE_PROVIDER_SITE_OTHER): Payer: 59 | Admitting: Gastroenterology

## 2011-11-06 VITALS — BP 104/66 | HR 72 | Ht 70.0 in | Wt 191.4 lb

## 2011-11-06 DIAGNOSIS — K559 Vascular disorder of intestine, unspecified: Secondary | ICD-10-CM

## 2011-11-06 DIAGNOSIS — I10 Essential (primary) hypertension: Secondary | ICD-10-CM

## 2011-11-06 LAB — BASIC METABOLIC PANEL WITH GFR
BUN: 14 mg/dL (ref 6–23)
CO2: 30 meq/L (ref 19–32)
Calcium: 8.8 mg/dL (ref 8.4–10.5)
Chloride: 103 meq/L (ref 96–112)
Creatinine, Ser: 1 mg/dL (ref 0.4–1.5)
GFR: 78.96 mL/min
Glucose, Bld: 72 mg/dL (ref 70–99)
Potassium: 4.5 meq/L (ref 3.5–5.1)
Sodium: 140 meq/L (ref 135–145)

## 2011-11-06 LAB — CBC WITH DIFFERENTIAL/PLATELET
Basophils Absolute: 0 K/uL (ref 0.0–0.1)
Basophils Relative: 0.5 % (ref 0.0–3.0)
Eosinophils Absolute: 0.1 K/uL (ref 0.0–0.7)
Eosinophils Relative: 1.4 % (ref 0.0–5.0)
HCT: 40.3 % (ref 39.0–52.0)
Hemoglobin: 13.5 g/dL (ref 13.0–17.0)
Lymphocytes Relative: 35.6 % (ref 12.0–46.0)
Lymphs Abs: 1.8 K/uL (ref 0.7–4.0)
MCHC: 33.4 g/dL (ref 30.0–36.0)
MCV: 97.8 fl (ref 78.0–100.0)
Monocytes Absolute: 0.4 K/uL (ref 0.1–1.0)
Monocytes Relative: 7.3 % (ref 3.0–12.0)
Neutro Abs: 2.8 K/uL (ref 1.4–7.7)
Neutrophils Relative %: 55.2 % (ref 43.0–77.0)
Platelets: 272 K/uL (ref 150.0–400.0)
RBC: 4.13 Mil/uL — ABNORMAL LOW (ref 4.22–5.81)
RDW: 13 % (ref 11.5–14.6)
WBC: 5 K/uL (ref 4.5–10.5)

## 2011-11-06 LAB — IBC PANEL
Iron: 85 ug/dL (ref 42–165)
Transferrin: 226.9 mg/dL (ref 212.0–360.0)

## 2011-11-06 LAB — HEPATIC FUNCTION PANEL
ALT: 15 U/L (ref 0–53)
Total Protein: 6.5 g/dL (ref 6.0–8.3)

## 2011-11-06 LAB — PROTIME-INR
INR: 1.1 ratio — ABNORMAL HIGH (ref 0.8–1.0)
Prothrombin Time: 11.7 s (ref 10.2–12.4)

## 2011-11-06 LAB — FERRITIN: Ferritin: 28.1 ng/mL (ref 22.0–322.0)

## 2011-11-06 LAB — TSH: TSH: 4.05 u[IU]/mL (ref 0.35–5.50)

## 2011-11-06 NOTE — Progress Notes (Signed)
History of Present Illness:  This is a healthy 61 year old Caucasian male presented to the emergency department on August 11 with crampy lower nominal pain and bloody diarrhea. CT scan of the abdomen was consistent with ischemic colitis of the splenic flexure area of the colon. He was treated conservatively, and all the symptoms resolved in 24 hours. I previously done screening colonoscopy on this patient one year ago. That exam was unremarkable. He has regular bowel movements and denies current abdominal pain, rectal bleeding or melena. The patient had no precipitating elements to his abdominal pain episode such as dehydration, over-the-counter medications, extreme exercise, et Karie Soda. There is no history of recurrent deep vein thromboses or pulmonary emboli. Family history is noncontributory. At the time of his presentation he had mild leukocytosis, labs otherwise were unremarkable. History is positive for well-controlled essential hypertension and chronic thyroid dysfunction.  I have reviewed this patient's present history, medical and surgical past history, allergies and medications.     ROS: The remainder of the 10 point ROS is negative     Physical Exam: Very healthy-appearing patient in no acute distress. Blood pressure 104 was 66, pulse 72 and regular, and weight 191 pounds with a BMI of 27.46. General well developed well nourished patient in no acute distress, appearing their stated age Eyes PERRLA, no icterus, fundoscopic exam per opthamologist Skin no lesions noted Neck supple, no adenopathy, no thyroid enlargement, no tenderness Chest clear to percussion and auscultation Heart no significant murmurs, gallops or rubs noted Abdomen no hepatosplenomegaly masses or tenderness, BS normal.  Extremities no acute joint lesions, edema, phlebitis or evidence of cellulitis. Neurologic patient oriented x 3, cranial nerves intact, no focal neurologic deficits noted. Psychological mental status  normal and normal affect.  Assessment and plan: Resolved ischemic colitis of unexplained etiology. We will repeat a CBC, PT, and stool  IFOB cards. I had a long discussion with this patient concerning acute ischemic colitis with this presentation and symptoms. Hopefully this will not recur, and I've advised him to continue to keep himself well-hydrated, to avoid over-the-counter medications, NSAIDs, and if his problems recurred call us immediately. Review of his CT scan otherwise shows no vascular abnormalities. I do not think we need repeat his colonoscopy unless he still has blood in his stool or if his problems return. If that happens, he will need more thorough coagulation evaluation and mesenteric angiography.  Encounter Diagnosis  Name Primary?  . Ischemic colitis Yes

## 2011-11-06 NOTE — Patient Instructions (Addendum)
Your physician has requested that you go to the basement for the following lab work before leaving today: CC: JONES, THOMAS L

## 2011-11-07 LAB — FOLATE: Folate: 15.6 ng/mL (ref >5.9)

## 2011-11-08 ENCOUNTER — Other Ambulatory Visit: Payer: 59

## 2011-11-08 DIAGNOSIS — K559 Vascular disorder of intestine, unspecified: Secondary | ICD-10-CM

## 2011-11-08 LAB — FECAL OCCULT BLOOD, IMMUNOCHEMICAL: Fecal Occult Bld: NEGATIVE

## 2012-05-22 ENCOUNTER — Other Ambulatory Visit (INDEPENDENT_AMBULATORY_CARE_PROVIDER_SITE_OTHER): Payer: 59

## 2012-05-22 DIAGNOSIS — Z0000 Encounter for general adult medical examination without abnormal findings: Secondary | ICD-10-CM

## 2012-05-22 LAB — CBC WITH DIFFERENTIAL/PLATELET
Basophils Relative: 0.4 % (ref 0.0–3.0)
Eosinophils Absolute: 0.1 10*3/uL (ref 0.0–0.7)
Eosinophils Relative: 1.6 % (ref 0.0–5.0)
Lymphocytes Relative: 41.4 % (ref 12.0–46.0)
MCHC: 33.9 g/dL (ref 30.0–36.0)
Neutrophils Relative %: 51.3 % (ref 43.0–77.0)
RBC: 4.45 Mil/uL (ref 4.22–5.81)
WBC: 5.8 10*3/uL (ref 4.5–10.5)

## 2012-05-22 LAB — HEPATIC FUNCTION PANEL
Alkaline Phosphatase: 52 U/L (ref 39–117)
Bilirubin, Direct: 0.1 mg/dL (ref 0.0–0.3)
Total Protein: 6.5 g/dL (ref 6.0–8.3)

## 2012-05-22 LAB — LIPID PANEL
Cholesterol: 153 mg/dL (ref 0–200)
HDL: 50.5 mg/dL (ref >39.00)
VLDL: 15.4 mg/dL (ref 0.0–40.0)

## 2012-05-22 LAB — POCT URINALYSIS DIPSTICK
Bilirubin, UA: NEGATIVE
Ketones, UA: NEGATIVE
Leukocytes, UA: NEGATIVE
pH, UA: 7

## 2012-05-22 LAB — BASIC METABOLIC PANEL
Calcium: 8.5 mg/dL (ref 8.4–10.5)
Creatinine, Ser: 1 mg/dL (ref 0.4–1.5)
GFR: 81.58 mL/min (ref >60.00)

## 2012-05-29 ENCOUNTER — Encounter: Payer: 59 | Admitting: Family Medicine

## 2012-06-04 ENCOUNTER — Ambulatory Visit (INDEPENDENT_AMBULATORY_CARE_PROVIDER_SITE_OTHER): Payer: 59 | Admitting: Family

## 2012-06-04 ENCOUNTER — Encounter: Payer: Self-pay | Admitting: Family

## 2012-06-04 VITALS — BP 122/80 | HR 57 | Ht 69.5 in | Wt 196.0 lb

## 2012-06-04 DIAGNOSIS — E039 Hypothyroidism, unspecified: Secondary | ICD-10-CM

## 2012-06-04 DIAGNOSIS — E785 Hyperlipidemia, unspecified: Secondary | ICD-10-CM

## 2012-06-04 DIAGNOSIS — Z0000 Encounter for general adult medical examination without abnormal findings: Secondary | ICD-10-CM

## 2012-06-04 DIAGNOSIS — I10 Essential (primary) hypertension: Secondary | ICD-10-CM

## 2012-06-04 MED ORDER — LOSARTAN POTASSIUM 50 MG PO TABS: 50.0000 mg | ORAL_TABLET | Freq: Every day | ORAL | Status: DC

## 2012-06-04 MED ORDER — LEVOTHYROXINE SODIUM 100 MCG PO TABS: 100.0000 ug | ORAL_TABLET | Freq: Every day | ORAL | Status: DC

## 2012-06-04 MED ORDER — ACYCLOVIR 400 MG PO TABS: 400.0000 mg | ORAL_TABLET | ORAL | Status: DC | PRN

## 2012-06-04 MED ORDER — CELECOXIB 200 MG PO CAPS: 200.0000 mg | ORAL_CAPSULE | Freq: Every day | ORAL | Status: DC

## 2012-06-04 MED ORDER — SIMVASTATIN 20 MG PO TABS: 20.0000 mg | ORAL_TABLET | Freq: Every evening | ORAL | Status: DC

## 2012-06-04 NOTE — Progress Notes (Signed)
Subjective:    Patient ID: Alexander Griffith, male    DOB: August 21, 1950, 62 y.o.   MRN: [id number]  HPI 62 year old white male, nonsmoker is in for complete physical exam. All immunizations and health maintenance protocols were reviewed with the patient and they are up to date with these protocols. Screening laboratory values were reviewed with the patient including screening of hyperlipidemia PSA renal function and hepatic function. There medications past medical history social history problem list and allergies were reviewed in detail. Goals were established with regard to weight loss exercise diet in compliance with medications  Patient complains of left mid arm pain after splitting wood 2 weeks ago. He rates the pain as mildly to 2-3/10 but he really doesn't notice during the day but more at night. Pain is worse with heavy lifting. Described as achy. He has been taken Aleve that helps but hasn't rid the pain.  Review of Systems  Constitutional: Negative.   HENT: Negative.   Eyes: Negative.   Respiratory: Negative.   Cardiovascular: Negative.   Gastrointestinal: Negative.   Endocrine: Negative.   Genitourinary: Negative.   Musculoskeletal: Positive for arthralgias. Negative for back pain and gait problem.       Left arm pain  Allergic/Immunologic: Negative.   Neurological: Negative.   Hematological: Negative.   Psychiatric/Behavioral: Negative.    Past Medical History  Diagnosis Date  . Hyperlipidemia   . Hypertension   . Thyroid disease   . Colon polyps     1990s  . Cancer     skin  . HSV infection   . Diverticulosis of sigmoid colon     noted on 06/2010 colonoscopy.    History   Social History  . Marital Status: Married    Spouse Name: N/A    Number of Children: 2  . Years of Education: N/A   Occupational History  . [employer] [place]   Social History Main Topics  . Smoking status: Never Smoker   . Smokeless tobacco: Never Used  . Alcohol Use:  No     Comment: occ. social alcohol  . Drug Use: No  . Sexually Active: Not on file   Other Topics Concern  . Not on file   Social History Narrative  . No narrative on file    Past Surgical History  Procedure Laterality Date  . Achilles tendon repair    . Anterior cruciate ligament repair      right, 09  . Polypectomy      in 1990s, adenomatous colon polyp  . Colonoscopy    . Basal cell carcinoma excision      05/2010    Family History  Problem Relation Age of Onset  . Hypertension Mother   . Stroke Maternal Grandfather   . Heart disease Father   . Colon cancer Paternal Grandfather     No Known Allergies  Current Outpatient Prescriptions on File Prior to Visit  Medication Sig Dispense Refill  . Ascorbic Acid (VITAMIN C) 100 MG tablet Take 100 mg by mouth daily.        [name] aspirin 81 MG tablet Take 81 mg by mouth daily.        . Biotin 300 MCG TABS Take by mouth 2 (two) times daily.        [name] co-enzyme Q-10 30 MG capsule Take 30 mg by mouth daily.        . fish oil-omega-3 fatty acids 1000 MG capsule Take 1 g by mouth daily.      [name]  glucosamine-chondroitin 500-400 MG tablet Take 2 tablets by mouth daily.       . polyethylene glycol powder (GLYCOLAX/MIRALAX) powder Miralax 17 grams by mouth daily as needed for constipation. Today only, pt may take 2 doses.  255 g  3   No current facility-administered medications on file prior to visit.    BP 122/80  Pulse 57  Ht 5' 9.5" (1.765 m)  Wt 196 lb (88.905 kg)  BMI 28.54 kg/m2  SpO2 98%chart    Objective:   Physical Exam  Constitutional: He is oriented to person, place, and time. He appears well-developed and well-nourished.  HENT:  Head: Normocephalic and atraumatic.  Right Ear: External ear normal.  Left Ear: External ear normal.  Nose: Nose normal.  Mouth/Throat: Oropharynx is clear and moist.  Eyes: Conjunctivae and EOM are normal. Pupils are equal, round, and reactive to light.  Neck: Normal range of motion. Neck  supple. No thyromegaly present.  Cardiovascular: Normal rate, regular rhythm and normal heart sounds.   No murmur heard. Pulmonary/Chest: Effort normal and breath sounds normal.  Abdominal: Soft. Bowel sounds are normal. He exhibits no distension. There is no tenderness. There is no rebound.  Genitourinary: Rectum normal, prostate normal and penis normal. Guaiac negative stool. No penile tenderness.  Musculoskeletal: Normal range of motion. He exhibits no edema and no tenderness.  Neurological: He is alert and oriented to person, place, and time. He has normal reflexes. He displays normal reflexes. No cranial nerve deficit. Coordination normal.  Skin: Skin is warm and dry.  Psychiatric: He has a normal mood and affect.          Assessment & Plan:  Assessment:  1. CPX 2. Left Arm Pain/epicondylitis 3. Hypercholesterolemia 4. Hypertension  Plan: Continue current medications. Samples of Celebrex provided today for arm pain. Suggest he rest the left arm x2 weeks. Ice daily at bedtime. Exercise, increase cardiovascular exercise to 3 days a week from two.

## 2012-06-04 NOTE — Patient Instructions (Addendum)

## 2012-06-24 ENCOUNTER — Telehealth: Payer: Self-pay | Admitting: Family Medicine

## 2012-06-24 MED ORDER — ACYCLOVIR 400 MG PO TABS: 400.0000 mg | ORAL_TABLET | ORAL | Status: DC | PRN

## 2012-06-24 NOTE — Telephone Encounter (Signed)
Left message on machine medication sent to pharmacy as requested

## 2012-06-24 NOTE — Telephone Encounter (Signed)
PT called and stated that he has refilled 3 out of 4 medications through [facility]. He didn't fill his acyclovir (ZOVIRAX) 400 MG tablet, because its only 60 tablets with 3 refills. He is requesting that it be 90 tablets with 4 refills, like it has been in the past. Please assist.

## 2012-06-26 NOTE — Telephone Encounter (Signed)
Opened in error

## 2013-01-08 ENCOUNTER — Other Ambulatory Visit: Payer: Self-pay

## 2013-01-09 ENCOUNTER — Other Ambulatory Visit: Payer: Self-pay | Admitting: Family Medicine

## 2013-01-09 DIAGNOSIS — E039 Hypothyroidism, unspecified: Secondary | ICD-10-CM

## 2013-01-09 DIAGNOSIS — I10 Essential (primary) hypertension: Secondary | ICD-10-CM

## 2013-01-09 DIAGNOSIS — E785 Hyperlipidemia, unspecified: Secondary | ICD-10-CM

## 2013-06-02 ENCOUNTER — Other Ambulatory Visit (INDEPENDENT_AMBULATORY_CARE_PROVIDER_SITE_OTHER): Payer: 59

## 2013-06-02 DIAGNOSIS — E039 Hypothyroidism, unspecified: Secondary | ICD-10-CM

## 2013-06-02 DIAGNOSIS — E785 Hyperlipidemia, unspecified: Secondary | ICD-10-CM

## 2013-06-02 DIAGNOSIS — I10 Essential (primary) hypertension: Secondary | ICD-10-CM

## 2013-06-02 LAB — CBC WITH DIFFERENTIAL/PLATELET
BASOS ABS: 0 10*3/uL (ref 0.0–0.1)
BASOS PCT: 0.5 % (ref 0.0–3.0)
Eosinophils Absolute: 0.1 10*3/uL (ref 0.0–0.7)
Eosinophils Relative: 1.8 % (ref 0.0–5.0)
HEMATOCRIT: 42 % (ref 39.0–52.0)
HEMOGLOBIN: 14.3 g/dL (ref 13.0–17.0)
LYMPHS ABS: 2.5 10*3/uL (ref 0.7–4.0)
LYMPHS PCT: 42.9 % (ref 12.0–46.0)
MCHC: 33.9 g/dL (ref 30.0–36.0)
MCV: 98.3 fl (ref 78.0–100.0)
MONOS PCT: 6.2 % (ref 3.0–12.0)
Monocytes Absolute: 0.4 10*3/uL (ref 0.1–1.0)
NEUTROS ABS: 2.8 10*3/uL (ref 1.4–7.7)
Neutrophils Relative %: 48.6 % (ref 43.0–77.0)
Platelets: 220 10*3/uL (ref 150.0–400.0)
RBC: 4.28 Mil/uL (ref 4.22–5.81)
RDW: 12.6 % (ref 11.5–14.6)
WBC: 5.8 10*3/uL (ref 4.5–10.5)

## 2013-06-02 LAB — HEPATIC FUNCTION PANEL
ALBUMIN: 4.2 g/dL (ref 3.5–5.2)
ALT: 18 U/L (ref 0–53)
AST: 20 U/L (ref 0–37)
Alkaline Phosphatase: 51 U/L (ref 39–117)
Bilirubin, Direct: 0.1 mg/dL (ref 0.0–0.3)
Total Bilirubin: 1.1 mg/dL (ref 0.3–1.2)
Total Protein: 6.4 g/dL (ref 6.0–8.3)

## 2013-06-02 LAB — BASIC METABOLIC PANEL
BUN: 15 mg/dL (ref 6–23)
CALCIUM: 8.8 mg/dL (ref 8.4–10.5)
CO2: 30 mEq/L (ref 19–32)
Chloride: 102 mEq/L (ref 96–112)
Creatinine, Ser: 1.1 mg/dL (ref 0.4–1.5)
GFR: 75.14 mL/min (ref >60.00)
GLUCOSE: 106 mg/dL — AB (ref 70–99)
POTASSIUM: 4.3 meq/L (ref 3.5–5.1)
SODIUM: 138 meq/L (ref 135–145)

## 2013-06-02 LAB — URINALYSIS
Bilirubin Urine: NEGATIVE
Hgb urine dipstick: NEGATIVE
KETONES UR: NEGATIVE
LEUKOCYTES UA: NEGATIVE
Nitrite: NEGATIVE
Specific Gravity, Urine: 1.01 (ref 1.000–1.030)
Total Protein, Urine: NEGATIVE
UROBILINOGEN UA: 0.2 (ref 0.0–1.0)
Urine Glucose: NEGATIVE
pH: 6.5 (ref 5.0–8.0)

## 2013-06-02 LAB — LIPID PANEL
CHOL/HDL RATIO: 3
Cholesterol: 164 mg/dL (ref 0–200)
HDL: 52 mg/dL (ref >39.00)
LDL Cholesterol: 95 mg/dL (ref 0–99)
Triglycerides: 86 mg/dL (ref 0.0–149.0)
VLDL: 17.2 mg/dL (ref 0.0–40.0)

## 2013-06-02 LAB — TSH: TSH: 3.77 u[IU]/mL (ref 0.35–5.50)

## 2013-06-02 LAB — PSA: PSA: 1.53 ng/mL (ref 0.10–4.00)

## 2013-06-02 LAB — HEMOGLOBIN A1C: Hgb A1c MFr Bld: 5.3 % (ref 4.6–6.5)

## 2013-06-04 ENCOUNTER — Encounter: Payer: 59 | Admitting: Family Medicine

## 2013-06-09 ENCOUNTER — Encounter: Payer: Self-pay | Admitting: Family Medicine

## 2013-06-09 ENCOUNTER — Ambulatory Visit (INDEPENDENT_AMBULATORY_CARE_PROVIDER_SITE_OTHER): Payer: 59 | Admitting: Family Medicine

## 2013-06-09 VITALS — BP 118/70 | HR 60 | Temp 98.5°F | Ht 70.0 in | Wt 198.0 lb

## 2013-06-09 DIAGNOSIS — I10 Essential (primary) hypertension: Secondary | ICD-10-CM

## 2013-06-09 DIAGNOSIS — E785 Hyperlipidemia, unspecified: Secondary | ICD-10-CM

## 2013-06-09 DIAGNOSIS — E039 Hypothyroidism, unspecified: Secondary | ICD-10-CM

## 2013-06-09 NOTE — Progress Notes (Signed)
   Subjective:    Patient ID: Alexander Griffith, male    DOB: 03/17/1950, 63 y.o.   MRN: [id number]  HPI Alexander Griffith is a 62 year old married male nonsmoker who comes in today for general physical examination because of a history of hypothyroidism, hypertension, hyperlipidemia, and a history of skin cancer  His med list reviewed there've been no changes except he is now taking Celebrex any more. Advised in the future for aches and pain to take over-the-counter Motrin 400 mg twice a day when necessary  He gets routine eye care, dental care, colonoscopy recently because of a history colon polyps it was normal. Vaccinations up to date   Review of Systems  Constitutional: Negative.   HENT: Negative.   Eyes: Negative.   Respiratory: Negative.   Cardiovascular: Negative.   Gastrointestinal: Negative.   Genitourinary: Negative.   Musculoskeletal: Negative.   Skin: Negative.   Neurological: Negative.   Psychiatric/Behavioral: Negative.        Objective:   Physical Exam  Nursing note and vitals reviewed. Constitutional: He is oriented to person, place, and time. He appears well-developed and well-nourished.  HENT:  Head: Normocephalic and atraumatic.  Right Ear: External ear normal.  Left Ear: External ear normal.  Nose: Nose normal.  Mouth/Throat: Oropharynx is clear and moist.  Eyes: Conjunctivae and EOM are normal. Pupils are equal, round, and reactive to light.  Neck: Normal range of motion. Neck supple. No JVD present. No tracheal deviation present. No thyromegaly present.  Cardiovascular: Normal rate, regular rhythm, normal heart sounds and intact distal pulses.  Exam reveals no gallop and no friction rub.   No murmur heard. No carotid aortic bruits peripheral pulses 1+ and symmetrical  Pulmonary/Chest: Effort normal and breath sounds normal. No stridor. No respiratory distress. He has no wheezes. He has no rales. He exhibits no tenderness.  Abdominal: Soft. Bowel sounds are normal.  He exhibits no distension and no mass. There is no tenderness. There is no rebound and no guarding.  Genitourinary: Rectum normal, prostate normal and penis normal. Guaiac negative stool. No penile tenderness.  Musculoskeletal: Normal range of motion. He exhibits no edema and no tenderness.  Lymphadenopathy:    He has no cervical adenopathy.  Neurological: He is alert and oriented to person, place, and time. He has normal reflexes. No cranial nerve deficit. He exhibits normal muscle tone.  Skin: Skin is warm and dry. No rash noted. No erythema. No pallor.  Total body skin exam normal except for scars from previous skin cancers removed,,,,,,, basal cell  Psychiatric: He has a normal mood and affect. His behavior is normal. Judgment and thought content normal.          Assessment & Plan:  Healthy male  Hypertension at goal continue current medication  History of hypothyroidism normal TSH continue Synthroid 100 mcg daily  Hyperlipidemia lipids at goal with an LDL of 95 HDL 52 continue Zocor 20 mg a day along with an aspirin tablet  History of skin cancer again wear sunscreens yearly skin exam

## 2013-06-09 NOTE — Progress Notes (Signed)
Pre visit review using our clinic review tool, if applicable. No additional management support is needed unless otherwise documented below in the visit note. 

## 2013-06-09 NOTE — Patient Instructions (Addendum)
Continue your current medications  Followup in 1 year sooner if any problems  Remember to wear the sunscreen

## 2013-06-10 ENCOUNTER — Other Ambulatory Visit: Payer: Self-pay | Admitting: Family

## 2013-06-15 ENCOUNTER — Encounter: Payer: Self-pay | Admitting: Family Medicine

## 2013-06-15 MED ORDER — ACYCLOVIR 400 MG PO TABS: 400.0000 mg | ORAL_TABLET | ORAL | Status: DC | PRN

## 2013-12-18 ENCOUNTER — Other Ambulatory Visit: Payer: Self-pay

## 2014-03-03 ENCOUNTER — Encounter: Payer: Self-pay | Admitting: Gastroenterology

## 2014-03-09 ENCOUNTER — Telehealth: Payer: Self-pay | Admitting: Family Medicine

## 2014-03-09 DIAGNOSIS — E785 Hyperlipidemia, unspecified: Secondary | ICD-10-CM

## 2014-03-09 DIAGNOSIS — E039 Hypothyroidism, unspecified: Secondary | ICD-10-CM

## 2014-03-09 DIAGNOSIS — I10 Essential (primary) hypertension: Secondary | ICD-10-CM

## 2014-03-09 NOTE — Telephone Encounter (Signed)
Pt would like to go to [place] for his cpe labs. Can you put the order in? Pt has written on his calender when he needs to go prior to his cpe.

## 2014-03-09 NOTE — Telephone Encounter (Signed)
Labs ordered.

## 2014-05-18 ENCOUNTER — Other Ambulatory Visit: Payer: Self-pay | Admitting: Orthopedic Surgery

## 2014-06-17 ENCOUNTER — Other Ambulatory Visit (INDEPENDENT_AMBULATORY_CARE_PROVIDER_SITE_OTHER): Payer: 59

## 2014-06-17 DIAGNOSIS — E039 Hypothyroidism, unspecified: Secondary | ICD-10-CM | POA: Diagnosis not present

## 2014-06-17 DIAGNOSIS — I10 Essential (primary) hypertension: Secondary | ICD-10-CM

## 2014-06-17 DIAGNOSIS — E785 Hyperlipidemia, unspecified: Secondary | ICD-10-CM | POA: Diagnosis not present

## 2014-06-17 LAB — URINALYSIS
Bilirubin Urine: NEGATIVE
Hgb urine dipstick: NEGATIVE
Ketones, ur: NEGATIVE
LEUKOCYTES UA: NEGATIVE
Nitrite: NEGATIVE
Specific Gravity, Urine: 1.015 (ref 1.000–1.030)
Total Protein, Urine: NEGATIVE
UROBILINOGEN UA: 0.2 (ref 0.0–1.0)
Urine Glucose: NEGATIVE
pH: 6.5 (ref 5.0–8.0)

## 2014-06-17 LAB — CBC WITH DIFFERENTIAL/PLATELET
BASOS ABS: 0 10*3/uL (ref 0.0–0.1)
Basophils Relative: 0.5 % (ref 0.0–3.0)
Eosinophils Absolute: 0.1 10*3/uL (ref 0.0–0.7)
Eosinophils Relative: 2.2 % (ref 0.0–5.0)
HEMATOCRIT: 43.2 % (ref 39.0–52.0)
Hemoglobin: 14.7 g/dL (ref 13.0–17.0)
Lymphocytes Relative: 46.1 % — ABNORMAL HIGH (ref 12.0–46.0)
Lymphs Abs: 2.6 10*3/uL (ref 0.7–4.0)
MCHC: 34.1 g/dL (ref 30.0–36.0)
MCV: 94.1 fl (ref 78.0–100.0)
Monocytes Absolute: 0.3 10*3/uL (ref 0.1–1.0)
Monocytes Relative: 5.9 % (ref 3.0–12.0)
NEUTROS PCT: 45.3 % (ref 43.0–77.0)
Neutro Abs: 2.6 10*3/uL (ref 1.4–7.7)
PLATELETS: 246 10*3/uL (ref 150.0–400.0)
RBC: 4.59 Mil/uL (ref 4.22–5.81)
RDW: 12.8 % (ref 11.5–15.5)
WBC: 5.7 10*3/uL (ref 4.0–10.5)

## 2014-06-17 LAB — LIPID PANEL
Cholesterol: 144 mg/dL (ref 0–200)
HDL: 52.4 mg/dL (ref >39.00)
LDL CALC: 75 mg/dL (ref 0–99)
NONHDL: 91.6
Total CHOL/HDL Ratio: 3
Triglycerides: 81 mg/dL (ref 0.0–149.0)
VLDL: 16.2 mg/dL (ref 0.0–40.0)

## 2014-06-17 LAB — BASIC METABOLIC PANEL
BUN: 11 mg/dL (ref 6–23)
CHLORIDE: 105 meq/L (ref 96–112)
CO2: 30 meq/L (ref 19–32)
Calcium: 8.9 mg/dL (ref 8.4–10.5)
Creatinine, Ser: 1.04 mg/dL (ref 0.40–1.50)
GFR: 76.55 mL/min (ref >60.00)
GLUCOSE: 130 mg/dL — AB (ref 70–99)
POTASSIUM: 5.2 meq/L — AB (ref 3.5–5.1)
SODIUM: 140 meq/L (ref 135–145)

## 2014-06-17 LAB — TSH: TSH: 3.99 u[IU]/mL (ref 0.35–4.50)

## 2014-06-17 LAB — PSA: PSA: 1.34 ng/mL (ref 0.10–4.00)

## 2014-06-17 LAB — HEPATIC FUNCTION PANEL
ALBUMIN: 4 g/dL (ref 3.5–5.2)
ALK PHOS: 56 U/L (ref 39–117)
ALT: 18 U/L (ref 0–53)
AST: 20 U/L (ref 0–37)
BILIRUBIN TOTAL: 0.6 mg/dL (ref 0.2–1.2)
Bilirubin, Direct: 0.1 mg/dL (ref 0.0–0.3)
Total Protein: 6.4 g/dL (ref 6.0–8.3)

## 2014-06-24 ENCOUNTER — Encounter: Payer: Self-pay | Admitting: Family Medicine

## 2014-06-24 ENCOUNTER — Ambulatory Visit (INDEPENDENT_AMBULATORY_CARE_PROVIDER_SITE_OTHER): Payer: 59 | Admitting: Family Medicine

## 2014-06-24 VITALS — BP 140/80 | Temp 98.4°F | Ht 70.0 in | Wt 194.6 lb

## 2014-06-24 DIAGNOSIS — Z85828 Personal history of other malignant neoplasm of skin: Secondary | ICD-10-CM

## 2014-06-24 DIAGNOSIS — Z86010 Personal history of colonic polyps: Secondary | ICD-10-CM | POA: Diagnosis not present

## 2014-06-24 DIAGNOSIS — E785 Hyperlipidemia, unspecified: Secondary | ICD-10-CM

## 2014-06-24 DIAGNOSIS — Z0000 Encounter for general adult medical examination without abnormal findings: Secondary | ICD-10-CM | POA: Diagnosis not present

## 2014-06-24 DIAGNOSIS — I10 Essential (primary) hypertension: Secondary | ICD-10-CM

## 2014-06-24 DIAGNOSIS — R7309 Other abnormal glucose: Secondary | ICD-10-CM

## 2014-06-24 DIAGNOSIS — R739 Hyperglycemia, unspecified: Secondary | ICD-10-CM

## 2014-06-24 MED ORDER — LOSARTAN POTASSIUM 50 MG PO TABS: ORAL_TABLET | ORAL | Status: DC

## 2014-06-24 MED ORDER — LEVOTHYROXINE SODIUM 100 MCG PO TABS: ORAL_TABLET | ORAL | Status: DC

## 2014-06-24 MED ORDER — ACYCLOVIR 400 MG PO TABS: 400.0000 mg | ORAL_TABLET | ORAL | Status: DC | PRN

## 2014-06-24 MED ORDER — SIMVASTATIN 20 MG PO TABS: ORAL_TABLET | ORAL | Status: DC

## 2014-06-24 NOTE — Progress Notes (Signed)
Pre visit review using our clinic review tool, if applicable. No additional management support is needed unless otherwise documented below in the visit note. 

## 2014-06-24 NOTE — Progress Notes (Signed)
   Subjective:    Patient ID: Alexander Griffith, male    DOB: [birth year], 64 y.o.   MRN: [id number]  HPI Akin is a 64 year old married male nonsmoker..... [place] native went to [employer].. Works for [employer]........ who comes in today for general physical examination because of history of hypothyroidism, hyperlipidemia, hypertension  His blood pressure which he monitors at home is averaging 120/80  He takes Synthroid for hypothyroidism  He also takes Zocor 20 mg an aspirin tablet for hyperlipidemia. Total cholesterol 144 HDL 52 LDL 75  His fasting blood sugar was 1:30. No family history of diabetes. He did have some sugar the day before. He is not overweight he does exercise on a regular basis.  Vaccinations up-to-date  Social history he's married again works for [employer] ,,,,,,,,ope to retire next year   Review of Systems  Constitutional: Negative.   HENT: Negative.   Eyes: Negative.   Respiratory: Negative.   Cardiovascular: Negative.   Gastrointestinal: Negative.   Endocrine: Negative.   Genitourinary: Negative.   Musculoskeletal: Negative.   Skin: Negative.   Allergic/Immunologic: Negative.   Neurological: Negative.   Hematological: Negative.   Psychiatric/Behavioral: Negative.        Objective:   Physical Exam  Constitutional: He is oriented to person, place, and time. He appears well-developed and well-nourished.  HENT:  Head: Normocephalic and atraumatic.  Right Ear: External ear normal.  Left Ear: External ear normal.  Nose: Nose normal.  Mouth/Throat: Oropharynx is clear and moist.  Eyes: Conjunctivae and EOM are normal. Pupils are equal, round, and reactive to light.  Neck: Normal range of motion. Neck supple. No JVD present. No tracheal deviation present. No thyromegaly present.  Cardiovascular: Normal rate, regular rhythm, normal heart sounds and intact distal pulses.  Exam reveals no gallop and no friction rub.   No murmur heard. No carotid  nor aortic bruits peripheral pulses 2+ and symmetrical  Pulmonary/Chest: Effort normal and breath sounds normal. No stridor. No respiratory distress. He has no wheezes. He has no rales. He exhibits no tenderness.  Abdominal: Soft. Bowel sounds are normal. He exhibits no distension and no mass. There is no tenderness. There is no rebound and no guarding.  Genitourinary: Rectum normal, prostate normal and penis normal. Guaiac negative stool. No penile tenderness.  Musculoskeletal: Normal range of motion. He exhibits no edema or tenderness.  Lymphadenopathy:    He has no cervical adenopathy.  Neurological: He is alert and oriented to person, place, and time. He has normal reflexes. No cranial nerve deficit. He exhibits normal muscle tone.  Skin: Skin is warm and dry. No rash noted. No erythema. No pallor.  Total body skin exam normal except for some depigmented areas where he had a skin cancer removed  Psychiatric: He has a normal mood and affect. His behavior is normal. Judgment and thought content normal.  Nursing note and vitals reviewed.  Scar right hand he had surgery by Dr. Jeannie Fend recently for Dupuytren's contracture       Assessment & Plan:  Healthy male  Hypertension ago continue current therapy  Hyperlipidemia continue current therapy  Hypothyroidism continue current therapy  Elevated blood sugar,,,,,,,, carbohydrate free diet,,,,,,, follow-up A1c and fasting blood sugar in 4 weeks

## 2014-06-24 NOTE — Patient Instructions (Signed)
Continue current medications  Fasting labs in 4 weeks  We will call you the report  Rachel's extension is [phone]  Return in one year for general physical exam sooner if any problems

## 2014-07-22 ENCOUNTER — Other Ambulatory Visit (INDEPENDENT_AMBULATORY_CARE_PROVIDER_SITE_OTHER): Payer: 59

## 2014-07-22 ENCOUNTER — Encounter: Payer: Self-pay | Admitting: Family Medicine

## 2014-07-22 DIAGNOSIS — R739 Hyperglycemia, unspecified: Secondary | ICD-10-CM

## 2014-07-22 DIAGNOSIS — R7309 Other abnormal glucose: Secondary | ICD-10-CM | POA: Diagnosis not present

## 2014-07-22 LAB — BASIC METABOLIC PANEL
BUN: 10 mg/dL (ref 6–23)
CALCIUM: 8.7 mg/dL (ref 8.4–10.5)
CO2: 28 mEq/L (ref 19–32)
Chloride: 104 mEq/L (ref 96–112)
Creatinine, Ser: 0.94 mg/dL (ref 0.40–1.50)
GFR: 86 mL/min (ref >60.00)
Glucose, Bld: 110 mg/dL — ABNORMAL HIGH (ref 70–99)
Potassium: 3.7 mEq/L (ref 3.5–5.1)
Sodium: 139 mEq/L (ref 135–145)

## 2014-07-22 LAB — HEMOGLOBIN A1C: HEMOGLOBIN A1C: 5.2 % (ref 4.6–6.5)

## 2015-04-28 ENCOUNTER — Encounter: Payer: Self-pay | Admitting: Family Medicine

## 2015-04-29 MED ORDER — LOSARTAN POTASSIUM 50 MG PO TABS: ORAL_TABLET | ORAL | Status: DC

## 2015-04-29 MED ORDER — SIMVASTATIN 20 MG PO TABS: ORAL_TABLET | ORAL | Status: DC

## 2015-04-29 MED ORDER — ACYCLOVIR 400 MG PO TABS: 400.0000 mg | ORAL_TABLET | ORAL | Status: DC | PRN

## 2015-04-29 MED ORDER — LEVOTHYROXINE SODIUM 100 MCG PO TABS: ORAL_TABLET | ORAL | Status: DC

## 2015-05-03 ENCOUNTER — Other Ambulatory Visit: Payer: Self-pay | Admitting: *Deleted

## 2015-05-03 MED ORDER — ACYCLOVIR 400 MG PO TABS: 400.0000 mg | ORAL_TABLET | Freq: Two times a day (BID) | ORAL | Status: DC

## 2015-08-02 ENCOUNTER — Other Ambulatory Visit: Payer: 59

## 2015-08-09 ENCOUNTER — Encounter: Payer: 59 | Admitting: Family Medicine

## 2015-11-15 ENCOUNTER — Other Ambulatory Visit (INDEPENDENT_AMBULATORY_CARE_PROVIDER_SITE_OTHER): Payer: 59

## 2015-11-15 DIAGNOSIS — Z0000 Encounter for general adult medical examination without abnormal findings: Secondary | ICD-10-CM

## 2015-11-15 LAB — LIPID PANEL
CHOLESTEROL: 177 mg/dL (ref 0–200)
HDL: 59.3 mg/dL (ref >39.00)
LDL CALC: 94 mg/dL (ref 0–99)
NonHDL: 117.46
Total CHOL/HDL Ratio: 3
Triglycerides: 117 mg/dL (ref 0.0–149.0)
VLDL: 23.4 mg/dL (ref 0.0–40.0)

## 2015-11-15 LAB — HEPATIC FUNCTION PANEL
ALBUMIN: 4.2 g/dL (ref 3.5–5.2)
ALK PHOS: 49 U/L (ref 39–117)
ALT: 15 U/L (ref 0–53)
AST: 18 U/L (ref 0–37)
BILIRUBIN TOTAL: 0.8 mg/dL (ref 0.2–1.2)
Bilirubin, Direct: 0.1 mg/dL (ref 0.0–0.3)
Total Protein: 6.6 g/dL (ref 6.0–8.3)

## 2015-11-15 LAB — POC URINALSYSI DIPSTICK (AUTOMATED)
BILIRUBIN UA: NEGATIVE
Blood, UA: NEGATIVE
GLUCOSE UA: NEGATIVE
Ketones, UA: NEGATIVE
Leukocytes, UA: NEGATIVE
NITRITE UA: NEGATIVE
Protein, UA: NEGATIVE
Spec Grav, UA: 1.015
UROBILINOGEN UA: 0.2
pH, UA: 7

## 2015-11-15 LAB — BASIC METABOLIC PANEL
BUN: 16 mg/dL (ref 6–23)
CHLORIDE: 104 meq/L (ref 96–112)
CO2: 32 mEq/L (ref 19–32)
CREATININE: 1.04 mg/dL (ref 0.40–1.50)
Calcium: 8.9 mg/dL (ref 8.4–10.5)
GFR: 76.21 mL/min (ref >60.00)
Glucose, Bld: 113 mg/dL — ABNORMAL HIGH (ref 70–99)
POTASSIUM: 5 meq/L (ref 3.5–5.1)
Sodium: 140 mEq/L (ref 135–145)

## 2015-11-15 LAB — CBC WITH DIFFERENTIAL/PLATELET
BASOS ABS: 0 10*3/uL (ref 0.0–0.1)
Basophils Relative: 0.5 % (ref 0.0–3.0)
Eosinophils Absolute: 0.1 10*3/uL (ref 0.0–0.7)
Eosinophils Relative: 2.1 % (ref 0.0–5.0)
HCT: 42.8 % (ref 39.0–52.0)
Hemoglobin: 14.9 g/dL (ref 13.0–17.0)
LYMPHS ABS: 2.5 10*3/uL (ref 0.7–4.0)
Lymphocytes Relative: 44 % (ref 12.0–46.0)
MCHC: 34.8 g/dL (ref 30.0–36.0)
MCV: 94.8 fl (ref 78.0–100.0)
MONO ABS: 0.3 10*3/uL (ref 0.1–1.0)
MONOS PCT: 4.8 % (ref 3.0–12.0)
NEUTROS ABS: 2.7 10*3/uL (ref 1.4–7.7)
Neutrophils Relative %: 48.6 % (ref 43.0–77.0)
PLATELETS: 225 10*3/uL (ref 150.0–400.0)
RBC: 4.52 Mil/uL (ref 4.22–5.81)
RDW: 12.8 % (ref 11.5–15.5)
WBC: 5.7 10*3/uL (ref 4.0–10.5)

## 2015-11-15 LAB — PSA: PSA: 1.57 ng/mL (ref 0.10–4.00)

## 2015-11-15 LAB — TSH: TSH: 4.22 u[IU]/mL (ref 0.35–4.50)

## 2015-11-22 ENCOUNTER — Encounter: Payer: Self-pay | Admitting: Family Medicine

## 2015-11-22 ENCOUNTER — Ambulatory Visit (INDEPENDENT_AMBULATORY_CARE_PROVIDER_SITE_OTHER): Payer: 59 | Admitting: Family Medicine

## 2015-11-22 VITALS — BP 154/90 | HR 83 | Temp 98.3°F | Wt 192.1 lb

## 2015-11-22 DIAGNOSIS — I10 Essential (primary) hypertension: Secondary | ICD-10-CM

## 2015-11-22 DIAGNOSIS — Z23 Encounter for immunization: Secondary | ICD-10-CM

## 2015-11-22 DIAGNOSIS — E785 Hyperlipidemia, unspecified: Secondary | ICD-10-CM

## 2015-11-22 DIAGNOSIS — Z0000 Encounter for general adult medical examination without abnormal findings: Secondary | ICD-10-CM | POA: Diagnosis not present

## 2015-11-22 DIAGNOSIS — Z86010 Personal history of colonic polyps: Secondary | ICD-10-CM

## 2015-11-22 DIAGNOSIS — Z85828 Personal history of other malignant neoplasm of skin: Secondary | ICD-10-CM

## 2015-11-22 DIAGNOSIS — E039 Hypothyroidism, unspecified: Secondary | ICD-10-CM

## 2015-11-22 MED ORDER — LEVOTHYROXINE SODIUM 100 MCG PO TABS: ORAL_TABLET | ORAL | 3 refills | Status: DC

## 2015-11-22 MED ORDER — SIMVASTATIN 20 MG PO TABS: ORAL_TABLET | ORAL | 3 refills | Status: DC

## 2015-11-22 MED ORDER — ACYCLOVIR 400 MG PO TABS: 400.0000 mg | ORAL_TABLET | Freq: Two times a day (BID) | ORAL | 2 refills | Status: DC

## 2015-11-22 MED ORDER — LOSARTAN POTASSIUM 50 MG PO TABS: ORAL_TABLET | ORAL | 3 refills | Status: DC

## 2015-11-22 MED ORDER — SILDENAFIL CITRATE 20 MG PO TABS: 20.0000 mg | ORAL_TABLET | Freq: Every evening | ORAL | 11 refills | Status: DC | PRN

## 2015-11-22 NOTE — Progress Notes (Signed)
Alexander Griffith is a 65 year old married male nonsmoker who comes in today for general physical examination because of a history of hypothyroidism, hypertension, hyperlipidemia  His blood pressure is well maintained on Cozaar 50 mg daily. BP at home 120/80. When he comes and hears pressure goes up a little bit. It was 154/90 but again his blood pressures at home are all within normal range  He takes Synthroid 100 g daily because of a history of hypothyroidism TSH level within normal limits  He takes Zocor 20 mg daily along with a baby aspirin for hyper lipidemia. LDL is 117.  His fasting blood sugars 113. He does have a sweet tooth he tells me. No family history of diabetes and he exercises on a regular basis.  His weight is down 3 pounds from last year. He retired in February knees exercising 5 out of 7 days. Advised to stay away from high fructose corn syrup to get his weight down further and to normalize his blood sugar.  He gets routine eye care, dental care, colonoscopy 2012 was normal. Prior colonoscopy showed a polyp.  Vaccinations up-to-date tetanus booster 2010 seasonal flu shot given today.  Review of systems negative except he has difficulty going to sleep. Was ago sleepy stays asleep. He's tried his wife's Ambien and it helps. Advised over-the-counter medication melatonin no sleeping pills minimize caffeine consumption  Physical examination vital signs stable he is afebrile HEENT were negative except he wears contacts. Neck was supple no adenopathy thyroid normal no carotid bruits cardiopulmonary exam normal abdominal exam normal genitalia normal male male rectum normal stool guaiac-negative prostate 1+ symmetrical nonnodular BPH. Extremities normal total body skin exam normal. He has had a history of skin cancers in the past.  Impression  #1 hypertension at goal........ continue current medication check BP at home every morning for 2 weeks to be sure indeed it stays normal  #2  hyperlipidemia goal............ continue current therapy  #3 hypothyroidism............ continue Synthroid  #4 glucose intolerance........ daily exercise, no sugar,  Sleep dysfunction...........[name] recommend avoiding caffeine OTC melatonin  #6 history of skin cancer........... total body skin exam today was normal  #7 history of HPV.......[name] refill acyclovir

## 2015-11-22 NOTE — Patient Instructions (Signed)
For good sleep hygiene............ caffeine free diet,,,,,,,,, exercise daily in the morning,,,,,,,,, over-the-counter melatonin or Benadryl 12.5-25 mg would be safe to take  Continue other medications  Follow-up in one year sooner if any problems

## 2015-11-22 NOTE — Progress Notes (Signed)
Pre visit review using our clinic review tool, if applicable. No additional management support is needed unless otherwise documented below in the visit note. 

## 2016-08-22 DIAGNOSIS — G5692 Unspecified mononeuropathy of left upper limb: Secondary | ICD-10-CM | POA: Insufficient documentation

## 2016-10-26 ENCOUNTER — Other Ambulatory Visit: Payer: Self-pay | Admitting: Family Medicine

## 2016-11-16 ENCOUNTER — Other Ambulatory Visit: Payer: Self-pay | Admitting: Family Medicine

## 2016-11-19 ENCOUNTER — Other Ambulatory Visit: Payer: 59

## 2016-11-23 ENCOUNTER — Encounter: Payer: Self-pay | Admitting: Family Medicine

## 2016-11-26 ENCOUNTER — Ambulatory Visit (INDEPENDENT_AMBULATORY_CARE_PROVIDER_SITE_OTHER): Payer: Medicare Other | Admitting: Family Medicine

## 2016-11-26 ENCOUNTER — Encounter: Payer: Self-pay | Admitting: Family Medicine

## 2016-11-26 VITALS — BP 122/80 | HR 98 | Temp 98.4°F | Ht 70.0 in | Wt 185.0 lb

## 2016-11-26 DIAGNOSIS — I10 Essential (primary) hypertension: Secondary | ICD-10-CM

## 2016-11-26 DIAGNOSIS — Z0000 Encounter for general adult medical examination without abnormal findings: Secondary | ICD-10-CM

## 2016-11-26 DIAGNOSIS — E785 Hyperlipidemia, unspecified: Secondary | ICD-10-CM

## 2016-11-26 DIAGNOSIS — E039 Hypothyroidism, unspecified: Secondary | ICD-10-CM | POA: Diagnosis not present

## 2016-11-26 DIAGNOSIS — Z23 Encounter for immunization: Secondary | ICD-10-CM | POA: Diagnosis not present

## 2016-11-26 DIAGNOSIS — Z125 Encounter for screening for malignant neoplasm of prostate: Secondary | ICD-10-CM

## 2016-11-26 LAB — CBC WITH DIFFERENTIAL/PLATELET
BASOS ABS: 0 10*3/uL (ref 0.0–0.1)
Basophils Relative: 0.7 % (ref 0.0–3.0)
Eosinophils Absolute: 0 10*3/uL (ref 0.0–0.7)
Eosinophils Relative: 0.8 % (ref 0.0–5.0)
HEMATOCRIT: 40.3 % (ref 39.0–52.0)
HEMOGLOBIN: 13.5 g/dL (ref 13.0–17.0)
LYMPHS PCT: 34.8 % (ref 12.0–46.0)
Lymphs Abs: 1.9 10*3/uL (ref 0.7–4.0)
MCHC: 33.5 g/dL (ref 30.0–36.0)
MCV: 95.3 fl (ref 78.0–100.0)
Monocytes Absolute: 0.3 10*3/uL (ref 0.1–1.0)
Monocytes Relative: 6.4 % (ref 3.0–12.0)
Neutro Abs: 3.1 10*3/uL (ref 1.4–7.7)
Neutrophils Relative %: 57.3 % (ref 43.0–77.0)
PLATELETS: 234 10*3/uL (ref 150.0–400.0)
RBC: 4.22 Mil/uL (ref 4.22–5.81)
RDW: 13 % (ref 11.5–15.5)
WBC: 5.4 10*3/uL (ref 4.0–10.5)

## 2016-11-26 LAB — HEPATIC FUNCTION PANEL
ALBUMIN: 4.1 g/dL (ref 3.5–5.2)
ALT: 15 U/L (ref 0–53)
AST: 22 U/L (ref 0–37)
Alkaline Phosphatase: 50 U/L (ref 39–117)
Bilirubin, Direct: 0.1 mg/dL (ref 0.0–0.3)
TOTAL PROTEIN: 6.3 g/dL (ref 6.0–8.3)
Total Bilirubin: 0.7 mg/dL (ref 0.2–1.2)

## 2016-11-26 LAB — BASIC METABOLIC PANEL
BUN: 16 mg/dL (ref 6–23)
CALCIUM: 8.6 mg/dL (ref 8.4–10.5)
CHLORIDE: 105 meq/L (ref 96–112)
CO2: 28 meq/L (ref 19–32)
Creatinine, Ser: 1.03 mg/dL (ref 0.40–1.50)
GFR: 76.82 mL/min (ref >60.00)
GLUCOSE: 102 mg/dL — AB (ref 70–99)
Potassium: 4 mEq/L (ref 3.5–5.1)
SODIUM: 141 meq/L (ref 135–145)

## 2016-11-26 LAB — POCT URINALYSIS DIPSTICK
BILIRUBIN UA: NEGATIVE
GLUCOSE UA: NEGATIVE
Ketones, UA: NEGATIVE
Leukocytes, UA: NEGATIVE
NITRITE UA: NEGATIVE
Protein, UA: NEGATIVE
RBC UA: NEGATIVE
Spec Grav, UA: 1.015 (ref 1.010–1.025)
Urobilinogen, UA: 0.2 E.U./dL
pH, UA: 6 (ref 5.0–8.0)

## 2016-11-26 LAB — LIPID PANEL
CHOL/HDL RATIO: 3
CHOLESTEROL: 155 mg/dL (ref 0–200)
HDL: 60.8 mg/dL (ref >39.00)
LDL CALC: 75 mg/dL (ref 0–99)
NonHDL: 94.5
TRIGLYCERIDES: 99 mg/dL (ref 0.0–149.0)
VLDL: 19.8 mg/dL (ref 0.0–40.0)

## 2016-11-26 LAB — TSH: TSH: 2.45 u[IU]/mL (ref 0.35–4.50)

## 2016-11-26 LAB — PSA: PSA: 1.65 ng/mL (ref 0.10–4.00)

## 2016-11-26 MED ORDER — LEVOTHYROXINE SODIUM 100 MCG PO TABS: 100.0000 ug | ORAL_TABLET | Freq: Every day | ORAL | 4 refills | Status: DC

## 2016-11-26 MED ORDER — LOSARTAN POTASSIUM 50 MG PO TABS: 50.0000 mg | ORAL_TABLET | Freq: Every day | ORAL | 4 refills | Status: DC

## 2016-11-26 MED ORDER — ACYCLOVIR 400 MG PO TABS: 400.0000 mg | ORAL_TABLET | Freq: Two times a day (BID) | ORAL | 2 refills | Status: DC

## 2016-11-26 MED ORDER — TADALAFIL 20 MG PO TABS: 20.0000 mg | ORAL_TABLET | ORAL | 11 refills | Status: DC | PRN

## 2016-11-26 NOTE — Patient Instructions (Signed)
Continue current medications diet and exercise program  Labs today............. I will call you if there is anything abnormal.  Next year request your labs to be done prior to your physical............ tell them yet hypertension hyperlipidemia and a thyroid problem  Call your insurance company about the shingles vaccine

## 2016-11-26 NOTE — Progress Notes (Addendum)
Alexander Griffith is a 66 year old married male nonsmoker who comes in today for general physical examination because of a history of hypertension, hyperlipidemia, hypothyroidism  He takes Cozaar 50 mg daily for hypertension. BP 122/80. He checks his blood pressure weekly at home. It continues to be normal there also.  He takes Synthroid 100 g daily because of a history of hypothyroidism  He uses Zocor 20 mg daily along with an aspirin tablet because of a history of hyperlipidemia. Lipids will be checked today  He's acyclovir when necessary for HSV one and generic Viagra.  He gets routine eye care, dental care, colonoscopy 2012 normal  Vaccinations tetanus booster was given 2010. Be given a flu shot and Pneumovax today.  Cognitive function normally exercises daily home health safety reviewed no issues identified, no guns in the house, he does have a healthcare power of attorney and living well  Social history,, married lives here in [place] retired from the city of [place]. He exercises on daily basis and helps take care of his new granddaughter  Family history unchanged........[name] except mother is 43 died this past May 08, 2022. 2 brothers and a health father died of lung cancer age 5  EKG was done because of a history of hypertension hyperlipidemia. EKG was normal and unchanged from previous cardiograms  14 point review of systems reviewed otherwise negative except for above.  BP 122/80 (BP Location: Left Arm, Patient Position: Sitting, Cuff Size: Normal)   Pulse 98   Temp 98.4 F (36.9 C) (Oral)   Ht 5\' 10"  (1.778 m)   Wt 185 lb (83.9 kg)   BMI 26.54 kg/m  Examination HEENT were negative neck was supple thyroid is not enlarged no carotid bruits cardiopulmonary exam normal nose exam normal tone 10 normal circumcised male. Rectal normal stool guaiac-negative prostate normal and was normal skin normal peripheral pulses normal  #1 hypertension at goal........ continue current therapy  #2  hypothyroidism........ check TSH level  #3 hyperlipidemia......... 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 trial of Cialis.[name]

## 2016-11-27 ENCOUNTER — Other Ambulatory Visit: Payer: Self-pay

## 2016-11-27 ENCOUNTER — Encounter: Payer: Self-pay | Admitting: Family Medicine

## 2016-11-27 MED ORDER — SIMVASTATIN 20 MG PO TABS: 20.0000 mg | ORAL_TABLET | Freq: Every evening | ORAL | 3 refills | Status: DC

## 2017-03-05 HISTORY — PX: CATARACT EXTRACTION: SUR2

## 2017-03-06 ENCOUNTER — Encounter: Payer: Self-pay | Admitting: Family Medicine

## 2017-03-18 ENCOUNTER — Encounter: Payer: Self-pay | Admitting: Family Medicine

## 2017-03-18 ENCOUNTER — Other Ambulatory Visit: Payer: Self-pay

## 2017-03-18 MED ORDER — ACYCLOVIR 400 MG PO TABS
400.0000 mg | ORAL_TABLET | Freq: Two times a day (BID) | ORAL | 4 refills | Status: DC
Start: 2017-03-18 — End: 2017-12-09

## 2017-03-18 MED ORDER — SIMVASTATIN 20 MG PO TABS: 20.0000 mg | ORAL_TABLET | Freq: Every evening | ORAL | 4 refills | Status: DC

## 2017-04-24 DIAGNOSIS — M25642 Stiffness of left hand, not elsewhere classified: Secondary | ICD-10-CM | POA: Insufficient documentation

## 2017-09-19 DIAGNOSIS — H269 Unspecified cataract: Secondary | ICD-10-CM | POA: Insufficient documentation

## 2017-09-19 HISTORY — DX: Unspecified cataract: H26.9

## 2017-09-24 ENCOUNTER — Encounter: Payer: Self-pay | Admitting: Family Medicine

## 2017-11-27 ENCOUNTER — Other Ambulatory Visit (INDEPENDENT_AMBULATORY_CARE_PROVIDER_SITE_OTHER): Payer: Medicare Other

## 2017-11-27 DIAGNOSIS — Z0000 Encounter for general adult medical examination without abnormal findings: Secondary | ICD-10-CM | POA: Diagnosis not present

## 2017-11-27 LAB — HEPATIC FUNCTION PANEL
ALK PHOS: 57 U/L (ref 39–117)
ALT: 15 U/L (ref 0–53)
AST: 17 U/L (ref 0–37)
Albumin: 4.1 g/dL (ref 3.5–5.2)
Bilirubin, Direct: 0.1 mg/dL (ref 0.0–0.3)
Total Bilirubin: 0.6 mg/dL (ref 0.2–1.2)
Total Protein: 6.6 g/dL (ref 6.0–8.3)

## 2017-11-27 LAB — LIPID PANEL
CHOLESTEROL: 164 mg/dL (ref 0–200)
HDL: 62 mg/dL (ref >39.00)
LDL CALC: 83 mg/dL (ref 0–99)
NonHDL: 102.48
Total CHOL/HDL Ratio: 3
Triglycerides: 96 mg/dL (ref 0.0–149.0)
VLDL: 19.2 mg/dL (ref 0.0–40.0)

## 2017-11-27 LAB — CBC WITH DIFFERENTIAL/PLATELET
BASOS ABS: 0 10*3/uL (ref 0.0–0.1)
Basophils Relative: 0.7 % (ref 0.0–3.0)
Eosinophils Absolute: 0.1 10*3/uL (ref 0.0–0.7)
Eosinophils Relative: 2.2 % (ref 0.0–5.0)
HCT: 40.2 % (ref 39.0–52.0)
Hemoglobin: 13.9 g/dL (ref 13.0–17.0)
LYMPHS ABS: 2.9 10*3/uL (ref 0.7–4.0)
Lymphocytes Relative: 51.2 % — ABNORMAL HIGH (ref 12.0–46.0)
MCHC: 34.6 g/dL (ref 30.0–36.0)
MCV: 92.7 fl (ref 78.0–100.0)
Monocytes Absolute: 0.4 10*3/uL (ref 0.1–1.0)
Monocytes Relative: 6.5 % (ref 3.0–12.0)
NEUTROS PCT: 39.4 % — AB (ref 43.0–77.0)
Neutro Abs: 2.2 10*3/uL (ref 1.4–7.7)
Platelets: 258 10*3/uL (ref 150.0–400.0)
RBC: 4.33 Mil/uL (ref 4.22–5.81)
RDW: 13.2 % (ref 11.5–15.5)
WBC: 5.6 10*3/uL (ref 4.0–10.5)

## 2017-11-27 LAB — POC URINALSYSI DIPSTICK (AUTOMATED)
Bilirubin, UA: NEGATIVE
Blood, UA: NEGATIVE
GLUCOSE UA: NEGATIVE
Ketones, UA: NEGATIVE
LEUKOCYTES UA: NEGATIVE
NITRITE UA: NEGATIVE
PROTEIN UA: NEGATIVE
Spec Grav, UA: >=1.03 — AB (ref 1.010–1.025)
UROBILINOGEN UA: 0.2 U/dL
pH, UA: 6 (ref 5.0–8.0)

## 2017-11-27 LAB — BASIC METABOLIC PANEL
BUN: 14 mg/dL (ref 6–23)
CALCIUM: 8.5 mg/dL (ref 8.4–10.5)
CO2: 28 mEq/L (ref 19–32)
Chloride: 104 mEq/L (ref 96–112)
Creatinine, Ser: 1.03 mg/dL (ref 0.40–1.50)
GFR: 76.58 mL/min (ref >60.00)
GLUCOSE: 110 mg/dL — AB (ref 70–99)
Potassium: 3.7 mEq/L (ref 3.5–5.1)
Sodium: 140 mEq/L (ref 135–145)

## 2017-11-27 LAB — PSA: PSA: 1.25 ng/mL (ref 0.10–4.00)

## 2017-11-27 LAB — TSH: TSH: 3.77 u[IU]/mL (ref 0.35–4.50)

## 2017-12-02 ENCOUNTER — Ambulatory Visit (INDEPENDENT_AMBULATORY_CARE_PROVIDER_SITE_OTHER): Payer: Medicare Other | Admitting: Family Medicine

## 2017-12-02 ENCOUNTER — Encounter: Payer: Self-pay | Admitting: Family Medicine

## 2017-12-02 VITALS — BP 130/80 | HR 80 | Temp 98.4°F | Ht 69.0 in | Wt 192.8 lb

## 2017-12-02 DIAGNOSIS — E785 Hyperlipidemia, unspecified: Secondary | ICD-10-CM | POA: Diagnosis not present

## 2017-12-02 DIAGNOSIS — I10 Essential (primary) hypertension: Secondary | ICD-10-CM

## 2017-12-02 DIAGNOSIS — Z0000 Encounter for general adult medical examination without abnormal findings: Secondary | ICD-10-CM | POA: Diagnosis not present

## 2017-12-02 DIAGNOSIS — E039 Hypothyroidism, unspecified: Secondary | ICD-10-CM | POA: Diagnosis not present

## 2017-12-02 DIAGNOSIS — Z23 Encounter for immunization: Secondary | ICD-10-CM

## 2017-12-02 MED ORDER — LOSARTAN POTASSIUM 50 MG PO TABS: 50.0000 mg | ORAL_TABLET | Freq: Every day | ORAL | 4 refills | Status: DC

## 2017-12-02 MED ORDER — TADALAFIL 20 MG PO TABS: 20.0000 mg | ORAL_TABLET | ORAL | 11 refills | Status: DC | PRN

## 2017-12-02 MED ORDER — LEVOTHYROXINE SODIUM 100 MCG PO TABS: 100.0000 ug | ORAL_TABLET | Freq: Every day | ORAL | 4 refills | Status: DC

## 2017-12-02 MED ORDER — SIMVASTATIN 20 MG PO TABS: 20.0000 mg | ORAL_TABLET | Freq: Every evening | ORAL | 4 refills | Status: DC

## 2017-12-02 NOTE — Patient Instructions (Signed)
Continue current medication good health habits  Return in 1 year or sooner if any problems.  I would recommend [employer]

## 2017-12-02 NOTE — Progress Notes (Signed)
Alexander Griffith is a delightful 67 year old married male non-smoker who comes in today for annual physical evaluation because of the following problems  He has a history of hypothyroidism and is on Synthroid 100 mcg daily.  He has a history of hypertension.  He is on Cozaar 50 mg daily.  BP today 130/80  He takes Zocor 20 mg daily along with a baby aspirin because of the history of hyper lipidemia.  His lipids are normal on medication  Uses generic Cialis for ED  He gets routine eye care.......Alexander Griffith recently had both cataracts removed and lens implants.... Regular dental care, colonoscopy was done 2012 it was normal.  Vaccinations he was given a pneumonia vaccine or flu shot.  He is very had the 2 new shingles vaccines.  Cognitive function normally walks daily home health safety reviewed no issues identified, no guns in the house, he does have a healthcare power of attorney and living well  14 point review of system reviewed otherwise negative, except for burping.  He has no chest pain shortness of breath burning in his chest etc. etc.  He does not wake him up at night.  He says it bothers his wife more than it bothers him.  Social history.......Alexander Griffith retired he and his wife take care of 2 grandchildren ages 16 and 5  BP 130/80 (BP Location: Right Arm, Patient Position: Sitting, Cuff Size: Normal)   Pulse 80   Temp 98.4 F (36.9 C) (Oral)   Ht 5\' 9"  (1.753 m)   Wt 192 lb 12.8 oz (87.5 kg)   SpO2 100%   BMI 28.47 kg/m  Well-developed well-nourished male no acute distress vital signs stable is afebrile HEENT were pertinent he has evidence of bilateral cataract removal and lens implants.  Neck was supple thyroid was not enlarged no carotid bruits.  Cardiopulmonary exam normal.  Abdominal exam was normal.  Genitalia normal circumcised male.  Rectal normal stool guaiac negative prostate normal extremities normal skin normal peripheral pulses normal  Except for scars medial portion of both ankles from previous  bone spur removal.  1.  Healthy male  2.  Hypertension at goal........ continue current therapy  3.  Hyperlipidemia........ continue current therapy labs normal  4.  Hypothyroidism......... continue current dose of Synthroid  EKG was done because of history of hypertension hyperlipidemia.  EKG was normal and unchanged

## 2017-12-06 ENCOUNTER — Encounter: Payer: Self-pay | Admitting: Family Medicine

## 2017-12-09 ENCOUNTER — Other Ambulatory Visit: Payer: Self-pay | Admitting: Family Medicine

## 2017-12-09 MED ORDER — ACYCLOVIR 400 MG PO TABS: 400.0000 mg | ORAL_TABLET | Freq: Two times a day (BID) | ORAL | 4 refills | Status: DC

## 2018-01-22 ENCOUNTER — Ambulatory Visit (INDEPENDENT_AMBULATORY_CARE_PROVIDER_SITE_OTHER): Payer: Medicare Other | Admitting: Family Medicine

## 2018-01-22 ENCOUNTER — Encounter: Payer: Self-pay | Admitting: Family Medicine

## 2018-01-22 VITALS — BP 128/84 | HR 64 | Temp 98.2°F | Ht 69.0 in | Wt 194.2 lb

## 2018-01-22 DIAGNOSIS — Z6828 Body mass index (BMI) 28.0-28.9, adult: Secondary | ICD-10-CM

## 2018-01-22 DIAGNOSIS — I10 Essential (primary) hypertension: Secondary | ICD-10-CM

## 2018-01-22 DIAGNOSIS — E785 Hyperlipidemia, unspecified: Secondary | ICD-10-CM | POA: Diagnosis not present

## 2018-01-22 DIAGNOSIS — E039 Hypothyroidism, unspecified: Secondary | ICD-10-CM | POA: Diagnosis not present

## 2018-01-22 DIAGNOSIS — M720 Palmar fascial fibromatosis [Dupuytren]: Secondary | ICD-10-CM | POA: Insufficient documentation

## 2018-01-22 DIAGNOSIS — B009 Herpesviral infection, unspecified: Secondary | ICD-10-CM | POA: Insufficient documentation

## 2018-01-22 DIAGNOSIS — N529 Male erectile dysfunction, unspecified: Secondary | ICD-10-CM | POA: Insufficient documentation

## 2018-01-22 NOTE — Assessment & Plan Note (Signed)
S:  controlled on simvastatin 20 mg Lab Results  Component Value Date   CHOL 164 11/27/2017   HDL 62.00 11/27/2017   LDLCALC 83 11/27/2017   TRIG 96.0 11/27/2017   CHOLHDL 3 11/27/2017   A/P: stable with LDL under 100- continue current rx

## 2018-01-22 NOTE — Assessment & Plan Note (Signed)
S: On thyroid medication-levothyroxine 100 mcg Lab Results  Component Value Date   TSH 3.77 11/27/2017   ROS-No hair or nail changes. No heat/cold intolerance. No constipation or diarrhea. Denies shakiness or anxiety.  A/P: stable- continue current rx

## 2018-01-22 NOTE — Progress Notes (Signed)
Phone: [phone]  Subjective:  Patient presents today to establish care with me as their new primary care provider. Patient was formerly a patient of Dr. Sherren Mocha. Chief complaint-noted.   See problem oriented charting ROS- No chest pain or shortness of breath. No headache or blurry vision- does have floaters but has seen optho and no concern  The following were reviewed and entered/updated in epic: Past Medical History:  Diagnosis Date  . Bilateral cataracts 09/19/2017  . Colon polyps    1990s  . Diverticulosis of sigmoid colon    noted on 06/2010 colonoscopy.  [name] History of skin cancer    skin  . HSV infection    acyclovir for cold sores  . Hyperlipidemia   . Hypertension   . Thyroid disease    Patient Active Problem List   Diagnosis Date Noted  . HSV infection     Priority: Medium  . Hyperlipemia 04/04/2007    Priority: Medium  . Hypothyroidism 12/06/2006    Priority: Medium  . Essential hypertension 12/06/2006    Priority: Medium  . SKIN CANCER, HX OF 12/06/2006    Priority: Medium  . Dupuytren contracture 01/22/2018    Priority: Low  . HIP PAIN, BILATERAL 04/04/2007    Priority: Low  . History of colonic polyps 12/06/2006    Priority: Low   Past Surgical History:  Procedure Laterality Date  . ACHILLES TENDON REPAIR    . ANTERIOR CRUCIATE LIGAMENT REPAIR     right, 09  . BASAL CELL CARCINOMA EXCISION     05/2010  . CATARACT EXTRACTION Bilateral 2019  . COLONOSCOPY    . Dupuytren's Contracture Bilateral   . POLYPECTOMY     in 1990s, adenomatous colon polyp    Family History  Problem Relation Age of Onset  . Colon cancer Paternal Grandfather   . Hypertension Mother        died at 63  . Stroke Maternal Grandfather   . Heart disease Father        heart attack 55, smoking  . Lung cancer Father        2 years after heart attack  . Other Daughter        accidental death- high level delsym ingredient    Medications- reviewed and updated Current  Outpatient Medications  Medication Sig Dispense Refill  . acyclovir (ZOVIRAX) 400 MG tablet Take 1 tablet (400 mg total) by mouth 2 (two) times daily. 180 tablet 4  . aspirin 81 MG tablet Take 81 mg by mouth daily.      . Biotin 300 MCG TABS Take by mouth 2 (two) times daily.      [name] co-enzyme Q-10 30 MG capsule Take 30 mg by mouth daily.      . diphenhydrAMINE (BENADRYL ALLERGY) 25 mg capsule     . fish oil-omega-3 fatty acids 1000 MG capsule Take 1 g by mouth daily.    [name] glucosamine-chondroitin 500-400 MG tablet Take 2 tablets by mouth daily.     [name] levothyroxine (SYNTHROID, LEVOTHROID) 100 MCG tablet Take 1 tablet (100 mcg total) by mouth daily. 100 tablet 4  . losartan (COZAAR) 50 MG tablet Take 1 tablet (50 mg total) by mouth daily. 100 tablet 4  . simvastatin (ZOCOR) 20 MG tablet Take 1 tablet (20 mg total) by mouth every evening. 90 tablet 4   No current facility-administered medications for this visit.     Allergies-reviewed and updated No Known Allergies  Social History   Social History Narrative  Married since 1974. Lost oldest daughter in 2008- high level delysm. 2 living children- 2 grandkids both in town- they keep the youngest 67 years old in 2019.       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 Held back some- because had to help wife after rotator cuff surgery.    Objective: BP 128/84 (BP Location: Left Arm, Cuff Size: Large)   Pulse 64   Temp 98.2 F (36.8 C) (Oral)   Ht 5\' 9"  (1.753 m)   Wt 194 lb 3.2 oz (88.1 kg)   SpO2 98%   BMI 28.68 kg/m  Gen: NAD, resting comfortably HEENT: Mucous membranes are moist. Oropharynx normal Neck: no thyromegaly CV: RRR no murmurs rubs or gallops Lungs: CTAB no crackles, wheeze, rhonchi Abdomen: soft/nontender/nondistended/normal bowel sounds. No rebound or guarding.  Ext: no edema Skin: warm, dry Neuro: grossly  normal, moves all extremities, PERRLA  Assessment/Plan:  Other notes: 1.had gotten down to 185 with healthy eating and regular exercise- has been harder lately caring for grandkid and wife after her rotator cuff surgery- he is working his way back up and wants to work to get back down toward that weight. He does better with eating when exercising regularly.    Hypothyroidism S: On thyroid medication-levothyroxine 100 mcg Lab Results  Component Value Date   TSH 3.77 11/27/2017   ROS-No hair or nail changes. No heat/cold intolerance. No constipation or diarrhea. Denies shakiness or anxiety.  A/P: stable- continue current rx   Hyperlipemia S:  controlled on simvastatin 20 mg Lab Results  Component Value Date   CHOL 164 11/27/2017   HDL 62.00 11/27/2017   LDLCALC 83 11/27/2017   TRIG 96.0 11/27/2017   CHOLHDL 3 11/27/2017   A/P: stable with LDL under 100- continue current rx  Essential hypertension S: controlled on losartan 50 mg. Sometimes needs a recheck as has some anxiety at beginning of  visits BP Readings from Last 3 Encounters:  01/22/18 128/84  12/02/17 130/80  11/26/16 122/80  A/P: We discussed blood pressure goal of <140/90. Continue current meds  He may message me a 4-6 weeks ahead of physical to ask for labs to be ordered previsit- I told him I would be able to do that.   Future Appointments  Date Time Provider [place]  12/04/2018  8:40 AM Marin Olp, MD [facility]   He request once yearly physical  Return precautions advised.  Garret Reddish, MD

## 2018-01-22 NOTE — Patient Instructions (Addendum)
Health Maintenance Due  Topic Date Due  . Hepatitis C Screening - could consider with future labs 05/12/1950   As long as blood pressure remains <140/90 at home- im ok with once a year physicals- can go ahead and schedule your next one for 365 days past your last one with Dr. Sherren Mocha

## 2018-01-22 NOTE — Assessment & Plan Note (Signed)
S: controlled on losartan 50 mg. Sometimes needs a recheck as has some anxiety at beginning of  visits BP Readings from Last 3 Encounters:  01/22/18 128/84  12/02/17 130/80  11/26/16 122/80  A/P: We discussed blood pressure goal of <140/90. Continue current meds

## 2018-08-25 ENCOUNTER — Telehealth: Payer: Self-pay | Admitting: Physical Therapy

## 2018-08-25 NOTE — Telephone Encounter (Signed)
Called patient to schedule virtual visit to discuss. No need to route.

## 2018-08-25 NOTE — Telephone Encounter (Signed)
Copied from [place] [id number]. Topic: Appointment Scheduling - Scheduling Inquiry for Clinic >> Aug 25, 2018  9:59 AM Rayann Heman wrote: Reason for CRM: pt called and stated that he has a knot near left knee and would like to schedule an appointment. Please advise

## 2018-08-26 ENCOUNTER — Ambulatory Visit (INDEPENDENT_AMBULATORY_CARE_PROVIDER_SITE_OTHER): Payer: Medicare Other | Admitting: Family Medicine

## 2018-08-26 ENCOUNTER — Other Ambulatory Visit: Payer: Self-pay

## 2018-08-26 ENCOUNTER — Encounter: Payer: Self-pay | Admitting: Family Medicine

## 2018-08-26 VITALS — BP 136/78 | HR 66 | Temp 97.7°F | Ht 69.0 in | Wt 190.4 lb

## 2018-08-26 DIAGNOSIS — S8012XA Contusion of left lower leg, initial encounter: Secondary | ICD-10-CM

## 2018-08-26 DIAGNOSIS — I10 Essential (primary) hypertension: Secondary | ICD-10-CM

## 2018-08-26 DIAGNOSIS — E663 Overweight: Secondary | ICD-10-CM | POA: Diagnosis not present

## 2018-08-26 DIAGNOSIS — E785 Hyperlipidemia, unspecified: Secondary | ICD-10-CM

## 2018-08-26 NOTE — Progress Notes (Signed)
Phone [phone]   Subjective:  Alexander Griffith is a 68 y.o. year old very pleasant male patient who presents for/with See problem oriented charting Chief Complaint  Patient presents with  . Cyst    Left lower extremity, 2 weeks ago injuried leg   ROS- No fever, chills, cough, congestion, runny nose, shortness of breath, fatigue, body aches, sore throat, headache, nausea, vomiting, diarrhea, or new loss of taste or smell. No known contacts with covid 19 or someone being tested for covid 19.   Past Medical History-  Patient Active Problem List   Diagnosis Date Noted  . HSV infection     Priority: Medium  . Hyperlipemia 04/04/2007    Priority: Medium  . Hypothyroidism 12/06/2006    Priority: Medium  . Essential hypertension 12/06/2006    Priority: Medium  . SKIN CANCER, HX OF 12/06/2006    Priority: Medium  . Dupuytren contracture 01/22/2018    Priority: Low  . HIP PAIN, BILATERAL 04/04/2007    Priority: Low  . History of colonic polyps 12/06/2006    Priority: Low    Medications- reviewed and updated Current Outpatient Medications  Medication Sig Dispense Refill  . acyclovir (ZOVIRAX) 400 MG tablet Take 1 tablet (400 mg total) by mouth 2 (two) times daily. 180 tablet 4  . aspirin 81 MG tablet Take 81 mg by mouth daily.      . Biotin 300 MCG TABS Take by mouth 2 (two) times daily.      [name] co-enzyme Q-10 30 MG capsule Take 30 mg by mouth daily.      . diphenhydrAMINE (BENADRYL ALLERGY) 25 mg capsule     . fish oil-omega-3 fatty acids 1000 MG capsule Take 1 g by mouth daily.    [name] glucosamine-chondroitin 500-400 MG tablet Take 2 tablets by mouth daily.     [name] levothyroxine (SYNTHROID, LEVOTHROID) 100 MCG tablet Take 1 tablet (100 mcg total) by mouth daily. 100 tablet 4  . losartan (COZAAR) 50 MG tablet Take 1 tablet (50 mg total) by mouth daily. 100 tablet 4  . simvastatin (ZOCOR) 20 MG tablet Take 1 tablet (20 mg total) by mouth every evening. 90 tablet 4     Objective:   BP 136/78   Pulse 66   Temp 97.7 F (36.5 C) (Oral)   Ht 5\' 9"  (1.753 m)   Wt 190 lb 6.1 oz (86.4 kg)   SpO2 99%   BMI 28.11 kg/m  Gen: NAD, resting comfortably CV: RRR  Lungs: nonlabored, normal respiratory rate Abdomen: soft/nondistended Ext: no edema Skin: warm, dry Msk: mandarin oranged sized fluctuant area on left lower leg with erythema overlying. Not warm. Not tender to touch. Not mobile. Has some bruising at base of left foot as well.      Assessment and Plan   #Left lower leg wound S: about 2 weeks ago on Saturday- unloading wood trailer. Raining previous night and things were somewhat wet. Very heavy material. Put foot on rail for stability but foot slipped- above left ankle hit first and with high impact- then hit left upper inner leg as well when landed- but didn't hit as hard there. Didn't note any issues in left upper leg until 2 days later but left lower leg area . Large raised area. He requested an ice pack and ended up elevating it and stopping the job for the day. Started with aleve a few times a day. Tried an OTC topical cream. States skin never broke.   Over  the weekend loaded more - noted slight darker red patch on top of wound and then noted some bruising down in left lower foot.  A/P: Traumatic hematoma noted. From AVS "  hematoma of left lower leg -ok to remain active -continue to monitor and let us know if worsening redness or pain particularly at hematoma site or if you note warmth -as long as continues to improve over next 3-4 weeks fine to observe but if not continuing to improve next week we could refer to Dr. Tamala Julian of sports medicine who could evaluate with ultrasound and even consider drainage "  #hypertension S: controlled on losartan 50 mg BP Readings from Last 3 Encounters:  08/26/18 136/78  01/22/18 128/84  12/02/17 130/80  A/P:  Stable. Continue current medications.    #hyperlipidemia S:  controlled on simvastatin 20 mg-compliant with  medication Lab Results  Component Value Date   CHOL 164 11/27/2017   HDL 62.00 11/27/2017   LDLCALC 83 11/27/2017   TRIG 96.0 11/27/2017   CHOLHDL 3 11/27/2017   A/P:  Stable. Continue current medications.     # overweight S:getting more active with exercise and trying to eat a healthy diet. Getting 15-16 miles on bike when he goes- hes enjoying that.  Weight is down a few pounds from last visit Wt Readings from Last 3 Encounters:  08/26/18 190 lb 6.1 oz (86.4 kg)  01/22/18 194 lb 3.2 oz (88.1 kg)  12/02/17 192 lb 12.8 oz (87.5 kg)  A/P: Congratulated patient on weight loss-Encouraged need for continued healthy eating, regular exercise.   Upcoming physical Future Appointments  Date Time Provider [place]  12/04/2018  8:40 AM Marin Olp, MD [facility]   Lab/Order associations:   ICD-10-CM   1. Traumatic hematoma of left lower leg, initial encounter  S80.12XA   2. Essential hypertension  I10   3. Hyperlipidemia, unspecified hyperlipidemia type  E78.5   4. Overweight  E66.3    Return precautions advised.  Garret Reddish, MD

## 2018-08-26 NOTE — Patient Instructions (Addendum)
Health Maintenance Due  Topic Date Due  . Hepatitis C Screening - future labs March 30, 1950  . TETANUS/TDAP - can get this at your pharmacy (covered under part D medicare which likely wouldn't cover it being done in office) 04/06/2018    Hematoma of left lower leg -ok to remain active -continue to monitor and let us know if worsening redness or pain particularly at hematoma site or if you note warmth -as long as continues to improve over next 3-4 weeks fine to observe but if not continuing to improve next week we could refer to Dr. Tamala Julian of sports medicine who could evaluate with ultrasound and even consider drainage

## 2018-09-01 ENCOUNTER — Encounter: Payer: Self-pay | Admitting: Family Medicine

## 2018-09-17 ENCOUNTER — Encounter: Payer: Self-pay | Admitting: Family Medicine

## 2018-09-17 NOTE — Telephone Encounter (Signed)
Called and spoke with patient. He would prefer to be seen by Dr. Yong Channel. Scheduled for 09/26/18. I advised that he call the office to be seen sooner by a different provider if he notices any new or worsening symptoms, and changes with his bowel or bladder habits or his breathing. Pt verbalized understanding.

## 2018-09-18 NOTE — Telephone Encounter (Signed)
Called and spoke with patient. Scheduled with Alexander Griffith 09/19/18 at 1:40.

## 2018-09-19 ENCOUNTER — Ambulatory Visit (INDEPENDENT_AMBULATORY_CARE_PROVIDER_SITE_OTHER): Payer: Medicare Other

## 2018-09-19 ENCOUNTER — Other Ambulatory Visit: Payer: Self-pay

## 2018-09-19 ENCOUNTER — Ambulatory Visit (INDEPENDENT_AMBULATORY_CARE_PROVIDER_SITE_OTHER): Payer: Medicare Other | Admitting: Physician Assistant

## 2018-09-19 ENCOUNTER — Encounter: Payer: Self-pay | Admitting: Physician Assistant

## 2018-09-19 VITALS — BP 126/80 | HR 89 | Temp 98.6°F | Ht 69.0 in | Wt 190.0 lb

## 2018-09-19 DIAGNOSIS — R0781 Pleurodynia: Secondary | ICD-10-CM

## 2018-09-19 NOTE — Patient Instructions (Signed)
Rib Fracture  A rib fracture is a break or crack in one of the bones of the ribs. The ribs are long, curved bones that wrap around your chest and attach to your spine and your breastbone. The ribs protect your heart, lungs, and other organs in the chest. A broken or cracked rib is often painful but is not usually serious. Most rib fractures heal on their own over time. However, rib fractures can be more serious if multiple ribs are broken or if broken ribs move out of place and push against other structures or organs. What are the causes? This condition is caused by:  Repetitive movements with high force, such as pitching a baseball or having severe coughing spells.  A direct blow to the chest, such as a sports injury, a car accident, or a fall.  Cancer that has spread to the bones, which can weaken bones and cause them to break. What are the signs or symptoms? Symptoms of this condition include:  Pain when you breathe in or cough.  Pain when someone presses on the injured area.  Feeling short of breath. How is this diagnosed? This condition is diagnosed with a physical exam and medical history. Imaging tests may also be done, such as:  Chest X-ray.  CT scan.  MRI.  Bone scan.  Chest ultrasound. How is this treated? Treatment for this condition depends on the severity of the fracture. Most rib fractures usually heal on their own in 1-3 months. Sometimes healing takes longer if there is a cough that does not stop or if there are other activities that make the injury worse (aggravating factors). While you heal, you will be given medicines to control the pain. You will also be taught deep breathing exercises. Severe injuries may require hospitalization or surgery. Follow these instructions at home: Managing pain, stiffness, and swelling  If directed, apply ice to the injured area. ? Put ice in a plastic bag. ? Place a towel between your skin and the bag. ? Leave the ice on for  20 minutes, 2-3 times a day.  Take over-the-counter and prescription medicines only as told by your health care provider. Activity  Avoid a lot of activity and any activities or movements that cause pain. Be careful during activities and avoid bumping the injured rib.  Slowly increase your activity as told by your health care provider. General instructions  Do deep breathing exercises as told by your health care provider. This helps prevent pneumonia, which is a common complication of a broken rib. Your health care provider may instruct you to: ? Take deep breaths several times a day. ? Try to cough several times a day, holding a pillow against the injured area. ? Use a device called incentive spirometer to practice deep breathing several times a day.  Drink enough fluid to keep your urine pale yellow.  Do not wear a rib belt or binder. These restrict breathing, which can lead to pneumonia.  Keep all follow-up visits as told by your health care provider. This is important. Contact a health care provider if:  You have a fever. Get help right away if:  You have difficulty breathing or you are short of breath.  You develop a cough that does not stop, or you cough up thick or bloody sputum.  You have nausea, vomiting, or pain in your abdomen.  Your pain gets worse and medicine does not help. Summary  A rib fracture is a break or crack in one of  the bones of the ribs.  A broken or cracked rib is often painful but is not usually serious.  Most rib fractures heal on their own over time.  Treatment for this condition depends on the severity of the fracture.  Avoid a lot of activity and any activities or movements that cause pain. This information is not intended to replace advice given to you by your health care provider. Make sure you discuss any questions you have with your health care provider. Document Released: 02/19/2005 Document Revised: 02/01/2017 Document Reviewed:  05/21/2016 Elsevier Patient Education  [place].
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
## 2018-09-20 ENCOUNTER — Encounter: Payer: Self-pay | Admitting: Physician Assistant

## 2018-09-26 ENCOUNTER — Ambulatory Visit: Payer: Medicare Other | Admitting: Family Medicine

## 2018-11-04 ENCOUNTER — Encounter: Payer: Self-pay | Admitting: Family Medicine

## 2018-11-04 DIAGNOSIS — Z125 Encounter for screening for malignant neoplasm of prostate: Secondary | ICD-10-CM

## 2018-11-04 DIAGNOSIS — I10 Essential (primary) hypertension: Secondary | ICD-10-CM

## 2018-11-04 DIAGNOSIS — E039 Hypothyroidism, unspecified: Secondary | ICD-10-CM

## 2018-11-05 NOTE — Addendum Note (Signed)
Addended by: Gwenyth Ober R on: 11/05/2018 04:19 PM   Modules accepted: Orders

## 2018-11-06 ENCOUNTER — Encounter: Payer: Self-pay | Admitting: Family Medicine

## 2018-11-13 ENCOUNTER — Encounter: Payer: Self-pay | Admitting: Family Medicine

## 2018-11-21 ENCOUNTER — Telehealth: Payer: Self-pay | Admitting: Family Medicine

## 2018-11-21 NOTE — Telephone Encounter (Signed)
I left a message asking the patient to call me at [phone] to confirm or decline AWV-I appointment with Loma Sousa on 11/26/2018 after labs.

## 2018-11-26 ENCOUNTER — Ambulatory Visit (INDEPENDENT_AMBULATORY_CARE_PROVIDER_SITE_OTHER): Payer: Medicare Other

## 2018-11-26 ENCOUNTER — Other Ambulatory Visit: Payer: Self-pay

## 2018-11-26 ENCOUNTER — Encounter: Payer: Self-pay | Admitting: Family Medicine

## 2018-11-26 ENCOUNTER — Other Ambulatory Visit (INDEPENDENT_AMBULATORY_CARE_PROVIDER_SITE_OTHER): Payer: Medicare Other

## 2018-11-26 VITALS — BP 134/72 | Temp 97.8°F | Ht 69.0 in | Wt 188.0 lb

## 2018-11-26 DIAGNOSIS — Z125 Encounter for screening for malignant neoplasm of prostate: Secondary | ICD-10-CM | POA: Diagnosis not present

## 2018-11-26 DIAGNOSIS — I10 Essential (primary) hypertension: Secondary | ICD-10-CM | POA: Diagnosis not present

## 2018-11-26 DIAGNOSIS — Z23 Encounter for immunization: Secondary | ICD-10-CM | POA: Diagnosis not present

## 2018-11-26 DIAGNOSIS — Z0000 Encounter for general adult medical examination without abnormal findings: Secondary | ICD-10-CM

## 2018-11-26 DIAGNOSIS — E039 Hypothyroidism, unspecified: Secondary | ICD-10-CM | POA: Diagnosis not present

## 2018-11-26 LAB — LIPID PANEL
Cholesterol: 162 mg/dL (ref 0–200)
HDL: 55.9 mg/dL (ref >39.00)
LDL Cholesterol: 87 mg/dL (ref 0–99)
NonHDL: 105.9
Total CHOL/HDL Ratio: 3
Triglycerides: 95 mg/dL (ref 0.0–149.0)
VLDL: 19 mg/dL (ref 0.0–40.0)

## 2018-11-26 LAB — CBC
HCT: 40 % (ref 39.0–52.0)
Hemoglobin: 13.3 g/dL (ref 13.0–17.0)
MCHC: 33.3 g/dL (ref 30.0–36.0)
MCV: 93.7 fl (ref 78.0–100.0)
Platelets: 264 10*3/uL (ref 150.0–400.0)
RBC: 4.27 Mil/uL (ref 4.22–5.81)
RDW: 14 % (ref 11.5–15.5)
WBC: 5.3 10*3/uL (ref 4.0–10.5)

## 2018-11-26 LAB — COMPREHENSIVE METABOLIC PANEL
ALT: 16 U/L (ref 0–53)
AST: 20 U/L (ref 0–37)
Albumin: 4.1 g/dL (ref 3.5–5.2)
Alkaline Phosphatase: 76 U/L (ref 39–117)
BUN: 18 mg/dL (ref 6–23)
CO2: 27 mEq/L (ref 19–32)
Calcium: 8.7 mg/dL (ref 8.4–10.5)
Chloride: 103 mEq/L (ref 96–112)
Creatinine, Ser: 0.95 mg/dL (ref 0.40–1.50)
GFR: 78.87 mL/min (ref >60.00)
Glucose, Bld: 104 mg/dL — ABNORMAL HIGH (ref 70–99)
Potassium: 4 mEq/L (ref 3.5–5.1)
Sodium: 140 mEq/L (ref 135–145)
Total Bilirubin: 0.6 mg/dL (ref 0.2–1.2)
Total Protein: 6.4 g/dL (ref 6.0–8.3)

## 2018-11-26 LAB — PSA: PSA: 1.17 ng/mL (ref 0.10–4.00)

## 2018-11-26 LAB — TSH: TSH: 5.76 u[IU]/mL — ABNORMAL HIGH (ref 0.35–4.50)

## 2018-11-26 NOTE — Patient Instructions (Signed)
Mr. Alexander Griffith , Thank you for taking time to come for your Medicare Wellness Visit. I appreciate your ongoing commitment to your health goals. Please review the following plan we discussed and let me know if I can assist you in the future.   Screening recommendations/referrals: Colorectal Screening: completed 06/12/10   Vision and Dental Exams: Recommended annual ophthalmology exams for early detection of glaucoma and other disorders of the eye Recommended annual dental exams for proper oral hygiene  Vaccinations: Influenza vaccine:  Pneumococcal vaccine: up to date; last 12/02/17 Tdap vaccine: up to date; last 09/01/18 Shingles vaccine:Shingrix completed   Advanced directives: Please bring a copy of your POA (Power of Tobias) and/or Living Will to your next appointment.  Goals: Recommend to drink at least 6-8 8oz glasses of water per day.  Next appointment: Please schedule your Annual Wellness Visit with your Nurse Health Advisor in one year.  Preventive Care 68 Years and Older, Male Preventive care refers to lifestyle choices and visits with your health care provider that can promote health and wellness. What does preventive care include?  A yearly physical exam. This is also called an annual well check.  Dental exams once or twice a year.  Routine eye exams. Ask your health care provider how often you should have your eyes checked.  Personal lifestyle choices, including:  Daily care of your teeth and gums.  Regular physical activity.  Eating a healthy diet.  Avoiding tobacco and drug use.  Limiting alcohol use.  Practicing safe sex.  Taking low doses of aspirin every day if recommended by your health care provider..  Taking vitamin and mineral supplements as recommended by your health care provider. What happens during an annual well check? The services and screenings done by your health care provider during your annual well check will depend on your age, overall  health, lifestyle risk factors, and family history of disease. Counseling  Your health care provider may ask you questions about your:  Alcohol use.  Tobacco use.  Drug use.  Emotional well-being.  Home and relationship well-being.  Sexual activity.  Eating habits.  History of falls.  Memory and ability to understand (cognition).  Work and work Statistician. Screening  You may have the following tests or measurements:  Height, weight, and BMI.  Blood pressure.  Lipid and cholesterol levels. These may be checked every 5 years, or more frequently if you are over 68 years old.  Skin check.  Lung cancer screening. You may have this screening every year starting at age 68 if you have a 30-pack-year history of smoking and currently smoke or have quit within the past 15 years.  Fecal occult blood test (FOBT) of the stool. You may have this test every year starting at age 68.  Flexible sigmoidoscopy or colonoscopy. You may have a sigmoidoscopy every 5 years or a colonoscopy every 10 years starting at age 68.  Prostate cancer screening. Recommendations will vary depending on your family history and other risks.  Hepatitis C blood test.  Hepatitis B blood test.  Sexually transmitted disease (STD) testing.  Diabetes screening. This is done by checking your blood sugar (glucose) after you have not eaten for a while (fasting). You may have this done every 1-3 years.  Abdominal aortic aneurysm (AAA) screening. You may need this if you are a current or former smoker.  Osteoporosis. You may be screened starting at age 68 if you are at high risk. Talk with your health care provider about your test  results, treatment options, and if necessary, the need for more tests. Vaccines  Your health care provider may recommend certain vaccines, such as:  Influenza vaccine. This is recommended every year.  Tetanus, diphtheria, and acellular pertussis (Tdap, Td) vaccine. You may need a Td  booster every 10 years.  Zoster vaccine. You may need this after age 72.  Pneumococcal 13-valent conjugate (PCV13) vaccine. One dose is recommended after age 68.  Pneumococcal polysaccharide (PPSV23) vaccine. One dose is recommended after age 68. Talk to your health care provider about which screenings and vaccines you need and how often you need them. This information is not intended to replace advice given to you by your health care provider. Make sure you discuss any questions you have with your health care provider. Document Released: 03/18/2015 Document Revised: 11/09/2015 Document Reviewed: 12/21/2014 Elsevier Interactive Patient Education  2017 [place] Prevention in the Home Falls can cause injuries. They can happen to people of all ages. There are many things you can do to make your home safe and to help prevent falls. What can I do on the outside of my home?  Regularly fix the edges of walkways and driveways and fix any cracks.  Remove anything that might make you trip as you walk through a door, such as a raised step or threshold.  Trim any bushes or trees on the path to your home.  Use bright outdoor lighting.  Clear any walking paths of anything that might make someone trip, such as rocks or tools.  Regularly check to see if handrails are loose or broken. Make sure that both sides of any steps have handrails.  Any raised decks and porches should have guardrails on the edges.  Have any leaves, snow, or ice cleared regularly.  Use sand or salt on walking paths during winter.  Clean up any spills in your garage right away. This includes oil or grease spills. What can I do in the bathroom?  Use night lights.  Install grab bars by the toilet and in the tub and shower. Do not use towel bars as grab bars.  Use non-skid mats or decals in the tub or shower.  If you need to sit down in the shower, use a plastic, non-slip stool.  Keep the floor dry. Clean up  any water that spills on the floor as soon as it happens.  Remove soap buildup in the tub or shower regularly.  Attach bath mats securely with double-sided non-slip rug tape.  Do not have throw rugs and other things on the floor that can make you trip. What can I do in the bedroom?  Use night lights.  Make sure that you have a light by your bed that is easy to reach.  Do not use any sheets or blankets that are too big for your bed. They should not hang down onto the floor.  Have a firm chair that has side arms. You can use this for support while you get dressed.  Do not have throw rugs and other things on the floor that can make you trip. What can I do in the kitchen?  Clean up any spills right away.  Avoid walking on wet floors.  Keep items that you use a lot in easy-to-reach places.  If you need to reach something above you, use a strong step stool that has a grab bar.  Keep electrical cords out of the way.  Do not use floor polish or wax that makes  floors slippery. If you must use wax, use non-skid floor wax.  Do not have throw rugs and other things on the floor that can make you trip. What can I do with my stairs?  Do not leave any items on the stairs.  Make sure that there are handrails on both sides of the stairs and use them. Fix handrails that are broken or loose. Make sure that handrails are as long as the stairways.  Check any carpeting to make sure that it is firmly attached to the stairs. Fix any carpet that is loose or worn.  Avoid having throw rugs at the top or bottom of the stairs. If you do have throw rugs, attach them to the floor with carpet tape.  Make sure that you have a light switch at the top of the stairs and the bottom of the stairs. If you do not have them, ask someone to add them for you. What else can I do to help prevent falls?  Wear shoes that:  Do not have high heels.  Have rubber bottoms.  Are comfortable and fit you well.  Are  closed at the toe. Do not wear sandals.  If you use a stepladder:  Make sure that it is fully opened. Do not climb a closed stepladder.  Make sure that both sides of the stepladder are locked into place.  Ask someone to hold it for you, if possible.  Clearly mark and make sure that you can see:  Any grab bars or handrails.  First and last steps.  Where the edge of each step is.  Use tools that help you move around (mobility aids) if they are needed. These include:  Canes.  Walkers.  Scooters.  Crutches.  Turn on the lights when you go into a dark area. Replace any light bulbs as soon as they burn out.  Set up your furniture so you have a clear path. Avoid moving your furniture around.  If any of your floors are uneven, fix them.  If there are any pets around you, be aware of where they are.  Review your medicines with your doctor. Some medicines can make you feel dizzy. This can increase your chance of falling. Ask your doctor what other things that you can do to help prevent falls. This information is not intended to replace advice given to you by your health care provider. Make sure you discuss any questions you have with your health care provider. Document Released: 12/16/2008 Document Revised: 07/28/2015 Document Reviewed: 03/26/2014 Elsevier Interactive Patient Education  2017 [employer].

## 2018-11-26 NOTE — Progress Notes (Signed)
I have reviewed and agree with note, evaluation, plan.   Areen Trautner, MD  
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
## 2018-12-01 NOTE — Patient Instructions (Addendum)
Health Maintenance Due  Topic Date Due  . Hepatitis C Screening -in 6 weeks with tsh labs [birth year]   Hypothyroidism-controlled on Levothyroxine 178mcg- has been on this dose for years- first time TSh has been off in years- we opted for 6 week repeat. Schedule a lab visit in 6 weeks before you go
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
## 2018-12-04 ENCOUNTER — Encounter: Payer: Self-pay | Admitting: Family Medicine

## 2018-12-04 ENCOUNTER — Ambulatory Visit (INDEPENDENT_AMBULATORY_CARE_PROVIDER_SITE_OTHER): Payer: Medicare Other | Admitting: Family Medicine

## 2018-12-04 ENCOUNTER — Other Ambulatory Visit: Payer: Self-pay

## 2018-12-04 VITALS — BP 128/88 | HR 65 | Temp 97.8°F | Ht 69.0 in | Wt 189.0 lb

## 2018-12-04 DIAGNOSIS — E039 Hypothyroidism, unspecified: Secondary | ICD-10-CM | POA: Diagnosis not present

## 2018-12-04 DIAGNOSIS — Z0000 Encounter for general adult medical examination without abnormal findings: Secondary | ICD-10-CM | POA: Diagnosis not present

## 2018-12-04 DIAGNOSIS — E785 Hyperlipidemia, unspecified: Secondary | ICD-10-CM | POA: Diagnosis not present

## 2018-12-04 DIAGNOSIS — I10 Essential (primary) hypertension: Secondary | ICD-10-CM

## 2018-12-04 DIAGNOSIS — Z125 Encounter for screening for malignant neoplasm of prostate: Secondary | ICD-10-CM

## 2018-12-04 DIAGNOSIS — Z1159 Encounter for screening for other viral diseases: Secondary | ICD-10-CM

## 2018-12-04 DIAGNOSIS — R739 Hyperglycemia, unspecified: Secondary | ICD-10-CM

## 2018-12-04 MED ORDER — LEVOTHYROXINE SODIUM 100 MCG PO TABS: 100.0000 ug | ORAL_TABLET | Freq: Every day | ORAL | 4 refills | Status: DC

## 2018-12-04 MED ORDER — LOSARTAN POTASSIUM 50 MG PO TABS: 50.0000 mg | ORAL_TABLET | Freq: Every day | ORAL | 4 refills | Status: DC

## 2018-12-04 MED ORDER — ACYCLOVIR 400 MG PO TABS: 400.0000 mg | ORAL_TABLET | Freq: Two times a day (BID) | ORAL | 3 refills | Status: DC

## 2018-12-04 MED ORDER — SIMVASTATIN 20 MG PO TABS: 20.0000 mg | ORAL_TABLET | Freq: Every evening | ORAL | 4 refills | Status: DC

## 2018-12-09 ENCOUNTER — Encounter: Payer: Self-pay | Admitting: Family Medicine

## 2019-01-12 ENCOUNTER — Other Ambulatory Visit: Payer: Self-pay

## 2019-01-12 ENCOUNTER — Other Ambulatory Visit (INDEPENDENT_AMBULATORY_CARE_PROVIDER_SITE_OTHER): Payer: Medicare Other

## 2019-01-12 DIAGNOSIS — E039 Hypothyroidism, unspecified: Secondary | ICD-10-CM | POA: Diagnosis not present

## 2019-01-12 DIAGNOSIS — Z1159 Encounter for screening for other viral diseases: Secondary | ICD-10-CM | POA: Diagnosis not present

## 2019-01-12 LAB — TSH: TSH: 3.63 u[IU]/mL (ref 0.35–4.50)

## 2019-01-13 LAB — HEPATITIS C ANTIBODY
Hepatitis C Ab: NONREACTIVE
SIGNAL TO CUT-OFF: 0.01 (ref <1.00)

## 2019-02-09 HISTORY — PX: SHOULDER ARTHROSCOPY: SHX128

## 2019-04-02 ENCOUNTER — Ambulatory Visit: Payer: Medicare Other

## 2019-04-09 ENCOUNTER — Ambulatory Visit: Payer: Medicare Other | Attending: Internal Medicine

## 2019-04-09 DIAGNOSIS — Z23 Encounter for immunization: Secondary | ICD-10-CM | POA: Insufficient documentation

## 2019-04-09 NOTE — Progress Notes (Signed)
   Covid-19 Vaccination Clinic  Name:  Alexander Griffith    MRN: [id number] DOB: [birth year]  04/09/2019  Alexander Griffith was observed post Covid-19 immunization for 15 minutes without incidence. He was provided with Vaccine Information Sheet and instruction to access the V-Safe system.   Alexander Griffith was instructed to call 911 with any severe reactions post vaccine: [name] Difficulty breathing  . Swelling of your face and throat  . A fast heartbeat  . A bad rash all over your body  . Dizziness and weakness    Immunizations Administered    Name Date Dose VIS Date Route   Pfizer COVID-19 Vaccine 04/09/2019  2:57 PM 0.3 mL 02/13/2019 Intramuscular   Manufacturer: [place]   Lot: [id number]   [place]: [id number]

## 2019-04-13 ENCOUNTER — Ambulatory Visit: Payer: Medicare Other

## 2019-05-05 ENCOUNTER — Ambulatory Visit: Payer: Medicare Other | Attending: Internal Medicine

## 2019-05-05 DIAGNOSIS — Z23 Encounter for immunization: Secondary | ICD-10-CM

## 2019-05-05 NOTE — Progress Notes (Signed)
   Covid-19 Vaccination Clinic  Name:  Alexander Griffith    MRN: [id number] DOB: 1950-11-27  05/05/2019  [name] was observed post Covid-19 immunization for 15 minutes without incident. He was provided with Vaccine Information Sheet and instruction to access the V-Safe system.   [name] was instructed to call 911 with any severe reactions post vaccine: [name] Difficulty breathing  . Swelling of face and throat  . A fast heartbeat  . A bad rash all over body  . Dizziness and weakness   Immunizations Administered    Name Date Dose VIS Date Route   Pfizer COVID-19 Vaccine 05/05/2019  9:46 AM 0.3 mL 02/13/2019 Intramuscular   Manufacturer: [place]   Lot: [id number]   [place]: [id number]

## 2019-05-31 ENCOUNTER — Encounter: Payer: Self-pay | Admitting: Family Medicine

## 2019-06-01 ENCOUNTER — Encounter: Payer: Self-pay | Admitting: Family Medicine

## 2019-06-01 ENCOUNTER — Ambulatory Visit (INDEPENDENT_AMBULATORY_CARE_PROVIDER_SITE_OTHER): Payer: Medicare Other | Admitting: Family Medicine

## 2019-06-01 ENCOUNTER — Other Ambulatory Visit: Payer: Self-pay

## 2019-06-01 VITALS — BP 152/82 | HR 75 | Temp 98.5°F | Resp 16 | Ht 69.0 in | Wt 190.6 lb

## 2019-06-01 DIAGNOSIS — E785 Hyperlipidemia, unspecified: Secondary | ICD-10-CM

## 2019-06-01 DIAGNOSIS — I10 Essential (primary) hypertension: Secondary | ICD-10-CM | POA: Diagnosis not present

## 2019-06-01 DIAGNOSIS — K4090 Unilateral inguinal hernia, without obstruction or gangrene, not specified as recurrent: Secondary | ICD-10-CM

## 2019-06-01 DIAGNOSIS — E039 Hypothyroidism, unspecified: Secondary | ICD-10-CM | POA: Diagnosis not present

## 2019-06-01 DIAGNOSIS — G43109 Migraine with aura, not intractable, without status migrainosus: Secondary | ICD-10-CM | POA: Insufficient documentation

## 2019-06-01 NOTE — Patient Instructions (Addendum)
We will call you within two weeks about your referral to general surgery for likely left hernia surgery. If you do not hear within 3 weeks, give [place] a call.   Blood pressure hair high today but has looked really good at home- as long as home #s remain low- I have no concern about proceeding forward with surgery as needed   Recommended follow up: as needed for this concern. Definitely see [place] yearly at least

## 2019-06-01 NOTE — Assessment & Plan Note (Signed)
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 They did not recommend further workup unless recurrent issues. Had a short term one a week later as well but was also short- between half an hour to 45 minutes for both -I encouraged him to continue to follow-up with ophthalmology

## 2019-06-01 NOTE — Progress Notes (Signed)
Phone [phone] In person visit   Subjective:   Alexander Griffith is a 69 y.o. year old very pleasant male patient who presents for/with See problem oriented charting Chief Complaint  Patient presents with  . Groin Pain    started at the beginning of the month, noticed a "pulling" sensation while remodeling bathroom, concerned it may be a side effect of COVID vaccine   This visit occurred during the SARS-CoV-2 public health emergency.  Safety protocols were in place, including screening questions prior to the visit, additional usage of staff PPE, and extensive cleaning of exam room while observing appropriate contact time as indicated for disinfecting solutions.   Past Medical History-  Patient Active Problem List   Diagnosis Date Noted  . Hyperglycemia 12/04/2018    Priority: Medium  . HSV infection     Priority: Medium  . Hyperlipemia 04/04/2007    Priority: Medium  . Hypothyroidism 12/06/2006    Priority: Medium  . Essential hypertension 12/06/2006    Priority: Medium  . SKIN CANCER, HX OF 12/06/2006    Priority: Medium  . Dupuytren contracture 01/22/2018    Priority: Low  . HIP PAIN, BILATERAL 04/04/2007    Priority: Low  . History of colonic polyps 12/06/2006    Priority: Low  . Ocular migraine 06/01/2019    Medications- reviewed and updated Current Outpatient Medications  Medication Sig Dispense Refill  . acyclovir (ZOVIRAX) 400 MG tablet Take 1 tablet (400 mg total) by mouth 2 (two) times daily. 180 tablet 3  . aspirin 81 MG tablet Take 81 mg by mouth daily.      . Biotin 300 MCG TABS Take by mouth 2 (two) times daily.      [name] co-enzyme Q-10 30 MG capsule Take 30 mg by mouth daily.      . diphenhydrAMINE (BENADRYL ALLERGY) 25 mg capsule     . fish oil-omega-3 fatty acids 1000 MG capsule Take 1 g by mouth daily.    [name] glucosamine-chondroitin 500-400 MG tablet Take 2 tablets by mouth daily.     [name] levothyroxine (SYNTHROID) 100 MCG tablet Take 1 tablet (100 mcg  total) by mouth daily. 100 tablet 4  . losartan (COZAAR) 50 MG tablet Take 1 tablet (50 mg total) by mouth daily. 100 tablet 4  . methocarbamol (ROBAXIN) 500 MG tablet TK 1 T PO Q 6 TO 8 H PRN FOR SPASM    . simvastatin (ZOCOR) 20 MG tablet Take 1 tablet (20 mg total) by mouth every evening. 100 tablet 4      Objective:  BP (!) 152/82   Pulse 75   Temp 98.5 F (36.9 C) (Oral)   Resp 16   Ht 5\' 9"  (1.753 m)   Wt 190 lb 9.6 oz (86.5 kg)   SpO2 99%   BMI 28.15 kg/m  Gen: NAD, resting comfortably CV: RRR  Lungs: nonlabored, normal respiratory rate Abdomen: soft/nondistended MSK: bulge in left groin compared to right concerning for inguinal hernia- worsened with valsalva but did not feel this through inguinal ring    Assessment and Plan   # finally healed from shoulder surgery - grade III ac separation.   #Left groin pain  S: 2nd covid vaccine was 05/05/19.  Right after that noted a pulling sensation in left groin. Was doing remodeling in his bathroom- and was active and noted some issues. Even though he took it easy after that continued to have some tenderness off and on in the groin. A week or  so after that got several bails of pinestraw and symptoms worsened again and made him want to quit. Also worked with some paver stones one at  [employer] and up and down really bothered him.   Was able to do 16 to 18 miles a day on the bike before this. Easily does 4 mets of activity.  A/P: Left groin pain appears to be related to inguinal hernia-palpable bulge in left groin compared to the right.  I recommend a general surgery referral and patient agrees to this-he states he would want surgery if continues to inhibit his life as much as this has recently.  Please note patient is able to easily complete 4 METS of activity and he is considered medically maximized for surgery though do see blood pressure discussion below  #hypertension S: compliant with losartan 50mg . Home #s less than 120.  BP  Readings from Last 3 Encounters:  06/01/19 (!) 152/82  12/04/18 128/88  11/26/18 134/72  A/P: Excellent control reported at home in good control last visits here-mildly elevated today systolic.  With excellent home readings as long as they continue to remain less than 140/90 I told him I would still consider him medically maximized for surgery at this time.  Continue current medications  #hypothyroidism S: compliant On thyroid medication-levothryoxine 100 mcg  Lab Results  Component Value Date   TSH 3.63 01/12/2019   A/P: Excellent control most check  #hyperlipidemia S: compliant with  simvastatin 20mg  Lab Results  Component Value Date   CHOL 162 11/26/2018   HDL 55.90 11/26/2018   LDLCALC 87 11/26/2018   TRIG 95.0 11/26/2018   CHOLHDL 3 11/26/2018   A/P: Reasonable control with LDL under 100.  His father did have heart disease at age 17 but was a smoker.  Patient with EKG September 2019-I do not feel strongly about repeating at this time given excellent exercise tolerance  .[email or web address]  Recommended follow up: Already scheduled for October physical I believe Future Appointments  Date Time Provider [place]  12/08/2019  8:00 AM Marin Olp, MD [facility]    Lab/Order associations:   ICD-10-CM   1. Left groin hernia  K40.90 Ambulatory referral to General Surgery  2. Ocular migraine  G43.109   3. Hypothyroidism, unspecified type  E03.9   4. Hyperlipidemia, unspecified hyperlipidemia type  E78.5   5. Essential hypertension  I10    Return precautions advised.  Garret Reddish, MD

## 2019-06-08 ENCOUNTER — Ambulatory Visit: Payer: Self-pay | Admitting: Surgery

## 2019-06-08 NOTE — H&P (View-Only) (Signed)
Alexander Griffith Documented: 06/08/2019 9:54 AM Location: [facility] Patient #: [id number] DOB: [birth year] Married / Language: Alexander Griffith / Race: White Male  History of Present Illness Alexander Moores A. Junette Bernat MD; 06/08/2019 12:11 PM) Patient words: Patient sent at the request of Dr. Yong Channel due to a bulge in his left groin. He noticed this one month ago. The bulge is got larger and location his left groin. It causes mild-to-moderate discomfort especially with heavy exertional work or lifting. He has no nausea or vomiting. There is no radiation of pain and the pain is improved with rest. No change in bowel or bladder function.  The patient is a 69 year old male.   Past Surgical History Emeline Gins, CMA; 06/08/2019 9:54 AM) Cataract Surgery Bilateral. Knee Surgery Right. Oral Surgery Shoulder Surgery Left.  Diagnostic Studies History Emeline Gins, [place]; 06/08/2019 9:54 AM) Colonoscopy 5-10 years ago  Allergies Emeline Gins, CMA; 06/08/2019 9:55 AM) No Known Drug Allergies [06/08/2019]: Allergies Reconciled  Medication History Emeline Gins, CMA; 06/08/2019 9:56 AM) Aspirin (81MG  Tablet, Oral) Active. Acyclovir (400MG  Tablet, Oral) Active. Levothyroxine Sodium (100MCG Tablet, Oral) Active. Losartan Potassium (50MG  Tablet, Oral) Active. Simvastatin (20MG  Tablet, Oral) Active. Medications Reconciled  Social History Emeline Gins, [place]; 06/08/2019 9:54 AM) Alcohol use Occasional alcohol use. Caffeine use Coffee. No drug use Tobacco use Never smoker.  Family History Emeline Gins, [place]; 06/08/2019 9:54 AM) Hypertension Father, Mother.  Other Problems Emeline Gins, CMA; 06/08/2019 9:54 AM) High blood pressure Hypercholesterolemia Thyroid Disease     Review of Systems Emeline Gins CMA; 06/08/2019 9:54 AM) General Not Present- Appetite Loss, Chills, Fatigue, Fever, Night Sweats, Weight Gain and Weight Loss. Skin Not Present- Change in  Wart/Mole, Dryness, Hives, Jaundice, New Lesions, Non-Healing Wounds, Rash and Ulcer. HEENT Not Present- Earache, Hearing Loss, Hoarseness, Nose Bleed, Oral Ulcers, Ringing in the Ears, Seasonal Allergies, Sinus Pain, Sore Throat, Visual Disturbances, Wears glasses/contact lenses and Yellow Eyes. Respiratory Not Present- Bloody sputum, Chronic Cough, Difficulty Breathing, Snoring and Wheezing. Breast Not Present- Breast Mass, Breast Pain, Nipple Discharge and Skin Changes. Cardiovascular Not Present- Chest Pain, Difficulty Breathing Lying Down, Leg Cramps, Palpitations, Rapid Heart Rate, Shortness of Breath and Swelling of Extremities. Gastrointestinal Not Present- Abdominal Pain, Bloating, Bloody Stool, Change in Bowel Habits, Chronic diarrhea, Constipation, Difficulty Swallowing, Excessive gas, Gets full quickly at meals, Hemorrhoids, Indigestion, Nausea, Rectal Pain and Vomiting. Male Genitourinary Not Present- Blood in Urine, Change in Urinary Stream, Frequency, Impotence, Nocturia, Painful Urination, Urgency and Urine Leakage. Musculoskeletal Not Present- Back Pain, Joint Pain, Joint Stiffness, Muscle Pain, Muscle Weakness and Swelling of Extremities. Neurological Not Present- Decreased Memory, Fainting, Headaches, Numbness, Seizures, Tingling, Tremor, Trouble walking and Weakness. Psychiatric Not Present- Anxiety, Bipolar, Change in Sleep Pattern, Depression, Fearful and Frequent crying. Endocrine Not Present- Cold Intolerance, Excessive Hunger, Hair Changes, Heat Intolerance, Hot flashes and New Diabetes. Hematology Not Present- Blood Thinners, Easy Bruising, Excessive bleeding, Gland problems, HIV and Persistent Infections.  Vitals Emeline Gins CMA; 06/08/2019 9:55 AM) 06/08/2019 9:55 AM Weight: 190.4 lb Height: 69in Body Surface Area: 2.02 m Body Mass Index: 28.12 kg/m  Temp.: 97.37F  Pulse: 86 (Regular)  BP: 148/82 (Sitting, Left Arm, Standard)        Physical Exam  (Alexander Ryback A. Nickolis Diel MD; 06/08/2019 12:12 PM)  General Mental Status-Alert. General Appearance-Consistent with stated age. Hydration-Well hydrated. Voice-Normal.  Head and Neck Head-normocephalic, atraumatic with no lesions or palpable masses. Trachea-midline. Thyroid Gland Characteristics - normal size and consistency.  Chest and Lung Exam Note:  No wheezing. Work of breathing normal  Cardiovascular Note: Normal sinus rhythm. No JVD  Abdomen Note: Reducible left inguinal hernia. No evidence of right inguinal hernia. Soft and nontender.  Neurologic Neurologic evaluation reveals -alert and oriented x 3 with no impairment of recent or remote memory. Mental Status-Normal.  Musculoskeletal Normal Exam - Left-Upper Extremity Strength Normal and Lower Extremity Strength Normal. Normal Exam - Right-Upper Extremity Strength Normal and Lower Extremity Strength Normal.    Assessment & Plan (Alexander Fulgham A. Rhylie Stehr MD; 06/08/2019 12:12 PM)  LEFT INGUINAL HERNIA (K40.90) Impression: Discussed laparoscopic and open techniques with the use of mesh. The pros and cons to each technique discussed. Potential complications of each technique discussed. Nonoperative management discussed as well as natural history of inguinal hernia and pathophysiology. He has opted for open repair of his left inguinal hernia with mesh. The risk of hernia repair include bleeding, infection, organ injury, bowel injury, bladder injury, nerve injury recurrent hernia, blood clots, worsening of underlying condition, chronic pain, mesh use, open surgery, death, and the need for other operations. Pt agrees to proceed  Current Plans You are being scheduled for surgery- Our schedulers will call you.  You should hear from our office's scheduling department within 5 working days about the location, date, and time of surgery. We try to make accommodations for patient's preferences in scheduling surgery, but  sometimes the OR schedule or the surgeon's schedule prevents [place] from making those accommodations.  If you have not heard from our office [phone]) in 5 working days, call the office and ask for your surgeon's nurse.  If you have other questions about your diagnosis, plan, or surgery, call the office and ask for your surgeon's nurse.  Pt Education - CCS Mesh education: discussed with patient and provided information. Pt Education - Consent for inguinal hernia : discussed with patient and provided information.

## 2019-06-08 NOTE — H&P (Signed)
Alexander Griffith Documented: 06/08/2019 9:54 AM Location: [facility] Patient #: [id number] DOB: [birth year] Married / Language: Alexander Griffith / Race: White Male  History of Present Illness Alexander Griffith; 06/08/2019 12:11 PM) Patient words: Patient sent at the request of Dr. Yong Channel due to a bulge in his left groin. He noticed this one month ago. The bulge is got larger and location his left groin. It causes mild-to-moderate discomfort especially with heavy exertional work or lifting. He has no nausea or vomiting. There is no radiation of pain and the pain is improved with rest. No change in bowel or bladder function.  The patient is a 69 year old male.   Past Surgical History Alexander Griffith, Alexander Griffith; 06/08/2019 9:54 AM) Cataract Surgery Bilateral. Knee Surgery Right. Oral Surgery Shoulder Surgery Left.  Diagnostic Studies History Alexander Griffith, [place]; 06/08/2019 9:54 AM) Colonoscopy 5-10 years ago  Allergies Alexander Griffith, Alexander Griffith; 06/08/2019 9:55 AM) No Known Drug Allergies [06/08/2019]: Allergies Reconciled  Medication History Alexander Griffith, Alexander Griffith; 06/08/2019 9:56 AM) Aspirin (81MG  Tablet, Oral) Active. Acyclovir (400MG  Tablet, Oral) Active. Levothyroxine Sodium (100MCG Tablet, Oral) Active. Losartan Potassium (50MG  Tablet, Oral) Active. Simvastatin (20MG  Tablet, Oral) Active. Medications Reconciled  Social History Alexander Griffith, [place]; 06/08/2019 9:54 AM) Alcohol use Occasional alcohol use. Caffeine use Coffee. No drug use Tobacco use Never smoker.  Family History Alexander Griffith, [place]; 06/08/2019 9:54 AM) Hypertension Father, Mother.  Other Problems Alexander Griffith, Alexander Griffith; 06/08/2019 9:54 AM) High blood pressure Hypercholesterolemia Thyroid Disease     Review of Systems Alexander Griffith Alexander Griffith; 06/08/2019 9:54 AM) General Not Present- Appetite Loss, Chills, Fatigue, Fever, Night Sweats, Weight Gain and Weight Loss. Skin Not Present- Change in  Wart/Mole, Dryness, Hives, Jaundice, New Lesions, Non-Healing Wounds, Rash and Ulcer. HEENT Not Present- Earache, Hearing Loss, Hoarseness, Nose Bleed, Oral Ulcers, Ringing in the Ears, Seasonal Allergies, Sinus Pain, Sore Throat, Visual Disturbances, Wears glasses/contact lenses and Yellow Eyes. Respiratory Not Present- Bloody sputum, Chronic Cough, Difficulty Breathing, Snoring and Wheezing. Breast Not Present- Breast Mass, Breast Pain, Nipple Discharge and Skin Changes. Cardiovascular Not Present- Chest Pain, Difficulty Breathing Lying Down, Leg Cramps, Palpitations, Rapid Heart Rate, Shortness of Breath and Swelling of Extremities. Gastrointestinal Not Present- Abdominal Pain, Bloating, Bloody Stool, Change in Bowel Habits, Chronic diarrhea, Constipation, Difficulty Swallowing, Excessive gas, Gets full quickly at meals, Hemorrhoids, Indigestion, Nausea, Rectal Pain and Vomiting. Male Genitourinary Not Present- Blood in Urine, Change in Urinary Stream, Frequency, Impotence, Nocturia, Painful Urination, Urgency and Urine Leakage. Musculoskeletal Not Present- Back Pain, Joint Pain, Joint Stiffness, Muscle Pain, Muscle Weakness and Swelling of Extremities. Neurological Not Present- Decreased Memory, Fainting, Headaches, Numbness, Seizures, Tingling, Tremor, Trouble walking and Weakness. Psychiatric Not Present- Anxiety, Bipolar, Change in Sleep Pattern, Depression, Fearful and Frequent crying. Endocrine Not Present- Cold Intolerance, Excessive Hunger, Hair Changes, Heat Intolerance, Hot flashes and New Diabetes. Hematology Not Present- Blood Thinners, Easy Bruising, Excessive bleeding, Gland problems, HIV and Persistent Infections.  Vitals Alexander Griffith Alexander Griffith; 06/08/2019 9:55 AM) 06/08/2019 9:55 AM Weight: 190.4 lb Height: 69in Body Surface Area: 2.02 m Body Mass Index: 28.12 kg/m  Temp.: 97.25F  Pulse: 86 (Regular)  BP: 148/82 (Sitting, Left Arm, Standard)        Physical Exam  (Alexander Griffith; 06/08/2019 12:12 PM)  General Mental Status-Alert. General Appearance-Consistent with stated age. Hydration-Well hydrated. Voice-Normal.  Head and Neck Head-normocephalic, atraumatic with no lesions or palpable masses. Trachea-midline. Thyroid Gland Characteristics - normal size and consistency.  Chest and Lung Exam Note:  No wheezing. Work of breathing normal  Cardiovascular Note: Normal sinus rhythm. No JVD  Abdomen Note: Reducible left inguinal hernia. No evidence of right inguinal hernia. Soft and nontender.  Neurologic Neurologic evaluation reveals -alert and oriented x 3 with no impairment of recent or remote memory. Mental Status-Normal.  Musculoskeletal Normal Exam - Left-Upper Extremity Strength Normal and Lower Extremity Strength Normal. Normal Exam - Right-Upper Extremity Strength Normal and Lower Extremity Strength Normal.    Assessment & Plan (Alexander Griffith; 06/08/2019 12:12 PM)  LEFT INGUINAL HERNIA (K40.90) Impression: Discussed laparoscopic and open techniques with the use of mesh. The pros and cons to each technique discussed. Potential complications of each technique discussed. Nonoperative management discussed as well as natural history of inguinal hernia and pathophysiology. He has opted for open repair of his left inguinal hernia with mesh. The risk of hernia repair include bleeding, infection, organ injury, bowel injury, bladder injury, nerve injury recurrent hernia, blood clots, worsening of underlying condition, chronic pain, mesh use, open surgery, death, and the need for other operations. Pt agrees to proceed  Current Plans You are being scheduled for surgery- Our schedulers will call you.  You should hear from our office's scheduling department within 5 working days about the location, date, and time of surgery. We try to make accommodations for patient's preferences in scheduling surgery, but  sometimes the OR schedule or the surgeon's schedule prevents [place] from making those accommodations.  If you have not heard from our office [phone]) in 5 working days, call the office and ask for your surgeon's nurse.  If you have other questions about your diagnosis, plan, or surgery, call the office and ask for your surgeon's nurse.  Pt Education - CCS Mesh education: discussed with patient and provided information. Pt Education - Consent for inguinal hernia : discussed with patient and provided information.

## 2019-06-09 ENCOUNTER — Ambulatory Visit: Payer: Medicare Other | Admitting: Family Medicine

## 2019-06-23 ENCOUNTER — Encounter (HOSPITAL_BASED_OUTPATIENT_CLINIC_OR_DEPARTMENT_OTHER): Payer: Self-pay | Admitting: Surgery

## 2019-06-23 ENCOUNTER — Other Ambulatory Visit: Payer: Self-pay

## 2019-06-24 ENCOUNTER — Encounter (HOSPITAL_BASED_OUTPATIENT_CLINIC_OR_DEPARTMENT_OTHER)
Admission: RE | Admit: 2019-06-24 | Discharge: 2019-06-24 | Disposition: A | Discharge status: Home / Self Care | Payer: Medicare Other | Source: Ambulatory Visit | Attending: Surgery | Admitting: Surgery

## 2019-06-24 DIAGNOSIS — Z01810 Encounter for preprocedural cardiovascular examination: Secondary | ICD-10-CM | POA: Diagnosis not present

## 2019-06-24 DIAGNOSIS — R001 Bradycardia, unspecified: Secondary | ICD-10-CM | POA: Insufficient documentation

## 2019-06-24 LAB — CBC WITH DIFFERENTIAL/PLATELET
Abs Immature Granulocytes: 0.02 10*3/uL (ref 0.00–0.07)
Basophils Absolute: 0 10*3/uL (ref 0.0–0.1)
Basophils Relative: 1 %
Eosinophils Absolute: 0.1 10*3/uL (ref 0.0–0.5)
Eosinophils Relative: 2 %
HCT: 43.6 % (ref 39.0–52.0)
Hemoglobin: 15 g/dL (ref 13.0–17.0)
Immature Granulocytes: 0 %
Lymphocytes Relative: 44 %
Lymphs Abs: 2.4 10*3/uL (ref 0.7–4.0)
MCH: 32.8 pg (ref 26.0–34.0)
MCHC: 34.4 g/dL (ref 30.0–36.0)
MCV: 95.2 fL (ref 80.0–100.0)
Monocytes Absolute: 0.3 10*3/uL (ref 0.1–1.0)
Monocytes Relative: 6 %
Neutro Abs: 2.5 10*3/uL (ref 1.7–7.7)
Neutrophils Relative %: 47 %
Platelets: 240 10*3/uL (ref 150–400)
RBC: 4.58 MIL/uL (ref 4.22–5.81)
RDW: 11.7 % (ref 11.5–15.5)
WBC: 5.4 10*3/uL (ref 4.0–10.5)
nRBC: 0 % (ref 0.0–0.2)

## 2019-06-24 LAB — COMPREHENSIVE METABOLIC PANEL
ALT: 17 U/L (ref 0–44)
AST: 22 U/L (ref 15–41)
Albumin: 3.7 g/dL (ref 3.5–5.0)
Alkaline Phosphatase: 57 U/L (ref 38–126)
Anion gap: 9 (ref 5–15)
BUN: 11 mg/dL (ref 8–23)
CO2: 24 mmol/L (ref 22–32)
Calcium: 8.5 mg/dL — ABNORMAL LOW (ref 8.9–10.3)
Chloride: 107 mmol/L (ref 98–111)
Creatinine, Ser: 0.99 mg/dL (ref 0.61–1.24)
GFR calc Af Amer: >60 mL/min (ref >60)
GFR calc non Af Amer: >60 mL/min (ref >60)
Glucose, Bld: 107 mg/dL — ABNORMAL HIGH (ref 70–99)
Potassium: 5.1 mmol/L (ref 3.5–5.1)
Sodium: 140 mmol/L (ref 135–145)
Total Bilirubin: 0.7 mg/dL (ref 0.3–1.2)
Total Protein: 6.4 g/dL — ABNORMAL LOW (ref 6.5–8.1)

## 2019-06-24 NOTE — Progress Notes (Signed)

## 2019-06-27 ENCOUNTER — Other Ambulatory Visit (HOSPITAL_COMMUNITY)
Admission: RE | Admit: 2019-06-27 | Discharge: 2019-06-27 | Disposition: A | Discharge status: Home / Self Care | Payer: Medicare Other | Source: Ambulatory Visit | Attending: Surgery | Admitting: Surgery

## 2019-06-27 DIAGNOSIS — Z01812 Encounter for preprocedural laboratory examination: Secondary | ICD-10-CM | POA: Insufficient documentation

## 2019-06-27 DIAGNOSIS — Z20822 Contact with and (suspected) exposure to covid-19: Secondary | ICD-10-CM | POA: Insufficient documentation

## 2019-06-27 LAB — SARS CORONAVIRUS 2 (TAT 6-24 HRS): SARS Coronavirus 2: NEGATIVE

## 2019-06-30 ENCOUNTER — Encounter (HOSPITAL_BASED_OUTPATIENT_CLINIC_OR_DEPARTMENT_OTHER): Payer: Self-pay | Admitting: Surgery

## 2019-06-30 NOTE — Anesthesia Preprocedure Evaluation (Addendum)
Anesthesia Evaluation  Patient identified by MRN, date of birth, ID band Patient awake    Reviewed: Allergy & Precautions, NPO status , Patient's Chart, lab work & pertinent test results  Airway Mallampati: II  TM Distance: >3 FB Neck ROM: Full    Dental no notable dental hx. (+) Teeth Intact   Pulmonary neg pulmonary ROS,    Pulmonary exam normal breath sounds clear to auscultation       Cardiovascular hypertension, Pt. on medications Normal cardiovascular exam Rhythm:Regular Rate:Normal     Neuro/Psych  Headaches, negative psych ROS   GI/Hepatic negative GI ROS, Neg liver ROS,   Endo/Other  Hypothyroidism Hyperlipidemia   Renal/GU negative Renal ROS  negative genitourinary   Musculoskeletal Left inguinal hernia   Abdominal   Peds  Hematology negative hematology ROS (+)   Anesthesia Other Findings   Reproductive/Obstetrics                            Anesthesia Physical Anesthesia Plan  ASA: II  Anesthesia Plan: General   Post-op Pain Management:  Regional for Post-op pain   Induction: Intravenous  PONV Risk Score and Plan: 4 or greater and Ondansetron and Treatment may vary due to age or medical condition  Airway Management Planned: LMA  Additional Equipment:   Intra-op Plan:   Post-operative Plan: Extubation in OR  Informed Consent: I have reviewed the patients History and Physical, chart, labs and discussed the procedure including the risks, benefits and alternatives for the proposed anesthesia with the patient or authorized representative who has indicated his/her understanding and acceptance.     Dental advisory given  Plan Discussed with: CRNA and Surgeon  Anesthesia Plan Comments:        Anesthesia Quick Evaluation

## 2019-07-01 ENCOUNTER — Ambulatory Visit (HOSPITAL_BASED_OUTPATIENT_CLINIC_OR_DEPARTMENT_OTHER): Payer: Medicare Other | Admitting: Anesthesiology

## 2019-07-01 ENCOUNTER — Encounter (HOSPITAL_BASED_OUTPATIENT_CLINIC_OR_DEPARTMENT_OTHER): Payer: Self-pay | Admitting: Surgery

## 2019-07-01 ENCOUNTER — Encounter (HOSPITAL_BASED_OUTPATIENT_CLINIC_OR_DEPARTMENT_OTHER)
Admission: RE | Disposition: A | Discharge status: Home / Self Care | Payer: Self-pay | Source: Home / Self Care | Attending: Surgery

## 2019-07-01 ENCOUNTER — Ambulatory Visit (HOSPITAL_BASED_OUTPATIENT_CLINIC_OR_DEPARTMENT_OTHER)
Admission: RE | Admit: 2019-07-01 | Discharge: 2019-07-01 | Disposition: A | Discharge status: Home / Self Care | Payer: Medicare Other | Attending: Surgery | Admitting: Surgery

## 2019-07-01 ENCOUNTER — Other Ambulatory Visit: Payer: Self-pay

## 2019-07-01 DIAGNOSIS — E7800 Pure hypercholesterolemia, unspecified: Secondary | ICD-10-CM | POA: Diagnosis not present

## 2019-07-01 DIAGNOSIS — I10 Essential (primary) hypertension: Secondary | ICD-10-CM | POA: Diagnosis not present

## 2019-07-01 DIAGNOSIS — D176 Benign lipomatous neoplasm of spermatic cord: Secondary | ICD-10-CM | POA: Diagnosis not present

## 2019-07-01 DIAGNOSIS — Z7982 Long term (current) use of aspirin: Secondary | ICD-10-CM | POA: Insufficient documentation

## 2019-07-01 DIAGNOSIS — E039 Hypothyroidism, unspecified: Secondary | ICD-10-CM | POA: Insufficient documentation

## 2019-07-01 DIAGNOSIS — K4090 Unilateral inguinal hernia, without obstruction or gangrene, not specified as recurrent: Secondary | ICD-10-CM | POA: Insufficient documentation

## 2019-07-01 DIAGNOSIS — Z79899 Other long term (current) drug therapy: Secondary | ICD-10-CM | POA: Diagnosis not present

## 2019-07-01 DIAGNOSIS — E785 Hyperlipidemia, unspecified: Secondary | ICD-10-CM | POA: Diagnosis not present

## 2019-07-01 DIAGNOSIS — Z79890 Hormone replacement therapy (postmenopausal): Secondary | ICD-10-CM | POA: Diagnosis not present

## 2019-07-01 HISTORY — DX: Hypothyroidism, unspecified: E03.9

## 2019-07-01 HISTORY — PX: INGUINAL HERNIA REPAIR: SHX194

## 2019-07-01 SURGERY — REPAIR, HERNIA, INGUINAL, ADULT
Anesthesia: General | Laterality: Left

## 2019-07-01 MED ORDER — CEFAZOLIN SODIUM-DEXTROSE 2-4 GM/100ML-% IV SOLN
INTRAVENOUS | Status: AC
  Filled 2019-07-01: qty 100

## 2019-07-01 MED ORDER — GLYCOPYRROLATE 0.2 MG/ML IJ SOLN
INTRAMUSCULAR | Status: DC | PRN
Start: 2019-07-01 — End: 2019-07-01
  Administered 2019-07-01: .1 mg via INTRAVENOUS

## 2019-07-01 MED ORDER — LACTATED RINGERS IV SOLN: INTRAVENOUS | Status: DC

## 2019-07-01 MED ORDER — GLYCOPYRROLATE PF 0.2 MG/ML IJ SOSY
PREFILLED_SYRINGE | INTRAMUSCULAR | Status: AC
  Filled 2019-07-01: qty 1

## 2019-07-01 MED ORDER — MIDAZOLAM HCL 5 MG/5ML IJ SOLN
INTRAMUSCULAR | Status: DC | PRN
  Administered 2019-07-01: 2 mg via INTRAVENOUS

## 2019-07-01 MED ORDER — OXYCODONE HCL 5 MG PO TABS
5.0000 mg | ORAL_TABLET | Freq: Four times a day (QID) | ORAL | 0 refills | Status: DC | PRN
Start: 2019-07-01 — End: 2019-12-08

## 2019-07-01 MED ORDER — ACETAMINOPHEN 500 MG PO TABS
1000.0000 mg | ORAL_TABLET | ORAL | Status: AC
  Administered 2019-07-01: 1000 mg via ORAL

## 2019-07-01 MED ORDER — PROPOFOL 10 MG/ML IV BOLUS
INTRAVENOUS | Status: DC | PRN
  Administered 2019-07-01: 180 mg via INTRAVENOUS

## 2019-07-01 MED ORDER — CHLORHEXIDINE GLUCONATE CLOTH 2 % EX PADS: 6.0000 | MEDICATED_PAD | Freq: Once | CUTANEOUS | Status: DC

## 2019-07-01 MED ORDER — EPHEDRINE SULFATE 50 MG/ML IJ SOLN
INTRAMUSCULAR | Status: DC | PRN
Start: 2019-07-01 — End: 2019-07-01
  Administered 2019-07-01 (×2): 10 mg via INTRAVENOUS

## 2019-07-01 MED ORDER — PROPOFOL 10 MG/ML IV BOLUS
INTRAVENOUS | Status: AC
  Filled 2019-07-01: qty 20

## 2019-07-01 MED ORDER — SUCCINYLCHOLINE CHLORIDE 20 MG/ML IJ SOLN
INTRAMUSCULAR | Status: DC | PRN
  Administered 2019-07-01: 50 mg via INTRAVENOUS

## 2019-07-01 MED ORDER — BUPIVACAINE HCL (PF) 0.25 % IJ SOLN
INTRAMUSCULAR | Status: DC | PRN
Start: 2019-07-01 — End: 2019-07-01
  Administered 2019-07-01: 5 mL

## 2019-07-01 MED ORDER — OXYCODONE HCL 5 MG/5ML PO SOLN: 5.0000 mg | Freq: Once | ORAL | Status: DC | PRN

## 2019-07-01 MED ORDER — FENTANYL CITRATE (PF) 100 MCG/2ML IJ SOLN
50.0000 ug | INTRAMUSCULAR | Status: DC | PRN
  Administered 2019-07-01: 50 ug via INTRAVENOUS

## 2019-07-01 MED ORDER — IBUPROFEN 800 MG PO TABS
800.0000 mg | ORAL_TABLET | Freq: Three times a day (TID) | ORAL | 0 refills | Status: DC | PRN
Start: 2019-07-01 — End: 2019-12-08

## 2019-07-01 MED ORDER — FENTANYL CITRATE (PF) 100 MCG/2ML IJ SOLN: 25.0000 ug | INTRAMUSCULAR | Status: DC | PRN

## 2019-07-01 MED ORDER — LIDOCAINE HCL (CARDIAC) PF 100 MG/5ML IV SOSY
PREFILLED_SYRINGE | INTRAVENOUS | Status: DC | PRN
  Administered 2019-07-01: 75 mg via INTRAVENOUS

## 2019-07-01 MED ORDER — ONDANSETRON HCL 4 MG/2ML IJ SOLN
INTRAMUSCULAR | Status: DC | PRN
  Administered 2019-07-01: 4 mg via INTRAVENOUS

## 2019-07-01 MED ORDER — ONDANSETRON HCL 4 MG/2ML IJ SOLN: 4.0000 mg | Freq: Once | INTRAMUSCULAR | Status: DC | PRN

## 2019-07-01 MED ORDER — EPHEDRINE 5 MG/ML INJ
INTRAVENOUS | Status: AC
  Filled 2019-07-01: qty 10

## 2019-07-01 MED ORDER — FENTANYL CITRATE (PF) 100 MCG/2ML IJ SOLN
INTRAMUSCULAR | Status: AC
  Filled 2019-07-01: qty 2

## 2019-07-01 MED ORDER — DEXAMETHASONE SODIUM PHOSPHATE 4 MG/ML IJ SOLN
INTRAMUSCULAR | Status: DC | PRN
  Administered 2019-07-01: 10 mg via INTRAVENOUS

## 2019-07-01 MED ORDER — CEFAZOLIN SODIUM-DEXTROSE 2-4 GM/100ML-% IV SOLN
2.0000 g | INTRAVENOUS | Status: AC
  Administered 2019-07-01: 2 g via INTRAVENOUS

## 2019-07-01 MED ORDER — ACETAMINOPHEN 500 MG PO TABS
ORAL_TABLET | ORAL | Status: AC
  Filled 2019-07-01: qty 2

## 2019-07-01 MED ORDER — FENTANYL CITRATE (PF) 100 MCG/2ML IJ SOLN
INTRAMUSCULAR | Status: DC | PRN
  Administered 2019-07-01: 100 ug via INTRAVENOUS

## 2019-07-01 MED ORDER — MIDAZOLAM HCL 2 MG/2ML IJ SOLN
INTRAMUSCULAR | Status: AC
  Filled 2019-07-01: qty 2

## 2019-07-01 MED ORDER — OXYCODONE HCL 5 MG PO TABS: 5.0000 mg | ORAL_TABLET | Freq: Once | ORAL | Status: DC | PRN

## 2019-07-01 MED ORDER — BUPIVACAINE LIPOSOME 1.3 % IJ SUSP
INTRAMUSCULAR | Status: DC | PRN
Start: 2019-07-01 — End: 2019-07-01
  Administered 2019-07-01: 10 mL via PERINEURAL

## 2019-07-01 MED ORDER — BUPIVACAINE HCL 0.5 % IJ SOLN
INTRAMUSCULAR | Status: DC | PRN
Start: 2019-07-01 — End: 2019-07-01
  Administered 2019-07-01: 20 mL

## 2019-07-01 MED ORDER — MIDAZOLAM HCL 2 MG/2ML IJ SOLN
1.0000 mg | INTRAMUSCULAR | Status: DC | PRN
  Administered 2019-07-01: 07:00:00 1 mg via INTRAVENOUS

## 2019-07-01 SURGICAL SUPPLY — 43 items
ADH SKN CLS APL DERMABOND .7 (GAUZE/BANDAGES/DRESSINGS) ×1
APL PRP STRL LF DISP 70% ISPRP (MISCELLANEOUS) ×1
BLADE CLIPPER SURG (BLADE) ×2 IMPLANT
BLADE SURG 15 STRL LF DISP TIS (BLADE) ×1 IMPLANT
BLADE SURG 15 STRL SS (BLADE) ×3
CANISTER SUCT 1200ML W/VALVE (MISCELLANEOUS) ×2 IMPLANT
CHLORAPREP W/TINT 26 (MISCELLANEOUS) ×3 IMPLANT
COVER BACK TABLE 60X90IN (DRAPES) ×3 IMPLANT
COVER MAYO STAND STRL (DRAPES) ×3 IMPLANT
COVER WAND RF STERILE (DRAPES) ×2 IMPLANT
DERMABOND ADVANCED (GAUZE/BANDAGES/DRESSINGS) ×2
DERMABOND ADVANCED .7 DNX12 (GAUZE/BANDAGES/DRESSINGS) ×1 IMPLANT
DRAIN PENROSE 1/2X12 LTX STRL (WOUND CARE) ×3 IMPLANT
DRAPE LAPAROTOMY TRNSV 102X78 (DRAPES) ×3 IMPLANT
DRAPE UTILITY XL STRL (DRAPES) ×3 IMPLANT
ELECT COATED BLADE 2.86 ST (ELECTRODE) ×3 IMPLANT
ELECT REM PT RETURN 9FT ADLT (ELECTROSURGICAL) ×3
ELECTRODE REM PT RTRN 9FT ADLT (ELECTROSURGICAL) ×1 IMPLANT
GLOVE BIOGEL PI IND STRL 8 (GLOVE) ×1 IMPLANT
GLOVE BIOGEL PI INDICATOR 8 (GLOVE) ×2
GLOVE ECLIPSE 8.0 STRL XLNG CF (GLOVE) ×3 IMPLANT
GOWN STRL REUS W/ TWL LRG LVL3 (GOWN DISPOSABLE) ×2 IMPLANT
GOWN STRL REUS W/TWL LRG LVL3 (GOWN DISPOSABLE) ×6
MESH HERNIA SYS ULTRAPRO LRG (Mesh General) ×2 IMPLANT
NDL HYPO 25X1 1.5 SAFETY (NEEDLE) ×1 IMPLANT
NEEDLE HYPO 25X1 1.5 SAFETY (NEEDLE) ×3 IMPLANT
NS IRRIG 1000ML POUR BTL (IV SOLUTION) ×2 IMPLANT
PENCIL SMOKE EVACUATOR (MISCELLANEOUS) ×3 IMPLANT
SET BASIN DAY SURGERY F.S. (CUSTOM PROCEDURE TRAY) ×3 IMPLANT
SLEEVE SCD COMPRESS KNEE MED (MISCELLANEOUS) ×3 IMPLANT
SPONGE LAP 4X18 RFD (DISPOSABLE) ×3 IMPLANT
SUT MON AB 4-0 PC3 18 (SUTURE) ×3 IMPLANT
SUT NOVA 0 T19/GS 22DT (SUTURE) ×4 IMPLANT
SUT NOVA NAB DX-16 0-1 5-0 T12 (SUTURE) ×6 IMPLANT
SUT VIC AB 2-0 SH 27 (SUTURE) ×3
SUT VIC AB 2-0 SH 27XBRD (SUTURE) ×1 IMPLANT
SUT VIC AB 3-0 54X BRD REEL (SUTURE) IMPLANT
SUT VIC AB 3-0 BRD 54 (SUTURE) ×3
SYR CONTROL 10ML LL (SYRINGE) ×3 IMPLANT
TOWEL GREEN STERILE FF (TOWEL DISPOSABLE) ×3 IMPLANT
TUBE CONNECTING 20'X1/4 (TUBING) ×1
TUBE CONNECTING 20X1/4 (TUBING) ×1 IMPLANT
YANKAUER SUCT BULB TIP NO VENT (SUCTIONS) ×2 IMPLANT

## 2019-07-01 NOTE — Op Note (Signed)
Preoperative diagnosis: Left inguinal hernia  Postoperative diagnosis: Same  Procedure: Repair of left inguinal hernia with mesh  Surgeon: Erroll Luna, MD  Anesthesia: General with left groin TAP block per anesthesia and 0.25% Marcaine plain  EBL: Minimal  Specimen: None  IV fluids: Per anesthesia record  Indications for procedure: The patient is a 69 year old male presents for repair of a symptomatic left inguinal hernia.  Open and laparoscopic techniques discussed as well as use of mesh long-term ramifications of mesh use including chronic pain.  Nonoperative techniques discussed.  He agreed to proceed with open repair of left inguinal hernia with mesh. The risk of hernia repair include bleeding,  Infection,   Recurrence of the hernia,  Mesh use, chronic pain,  Organ injury,  Bowel injury,  Bladder injury,   nerve injury with numbness around the incision,  Death,  and worsening of preexisting  medical problems.  The alternatives to surgery have been discussed as well..  Long term expectations of both operative and non operative treatments have been discussed.   The patient agrees to proceed.  Description of procedure: The patient was met in the holding area and questions were answered.  He underwent block per anesthesia the left groin.  That side was marked as correct.  He was taken back to the operating.  He is placed supine upon the OR table.  After induction of general esthesia, left inguinal region was prepped and draped in sterile fashion timeout performed.  Proper patient, site and procedure were verified.  Left groin incision was performed in the direction of the inguinal canal.  Dissection was carried down through Scarpa's fascia until the aponeurosis of the external Bleich was identified.  A small incision was made and Metzenbaum scissors were used to open the fascia through the external ring.  Cord structures and ilioinguinal nerve were encircled with quarter inch Penrose drain.  He  had a large lipoma and indirect hernia defect that were dissected off the cord structures carefully.  These were reduced back in the preperitoneal space.  Ultra pro hernia system was used and a large piece was placed.  The inner leaflet was placed into the preperitoneal space and the onlay was placed on the floor of the inguinal canal.  A slit was cut for the cord structures and nerve.  Mesh secured to the shelving edge of inguinal ligament, pubic tubercle and conjoined tendon with #1 Novafil.  A slit was cut for the cord and this was secured around the cord structures with #1 Novafil.  The nerve and cord exited the new internal ring without any undue tension or constriction.  There is no tension anastomosis.  No evidence of nerve entrapment.  Fascia then closed of the external Blake with 2-0 Vicryl.  3-0 Vicryl was used to approximate Scarpa's fascia and 4 Monocryl used to close the skin.  Dermabond applied.  All counts found to be correct.  The patient was awoke extubated taken to recovery in satisfactory condition.

## 2019-07-01 NOTE — Anesthesia Procedure Notes (Signed)
Procedure Name: Intubation Date/Time: 07/01/2019 7:53 AM Performed by: Marrianne Mood, CRNA Pre-anesthesia Checklist: Patient identified, Emergency Drugs available, Suction available, Patient being monitored and Timeout performed Patient Re-evaluated:Patient Re-evaluated prior to induction Oxygen Delivery Method: Circle system utilized Preoxygenation: Pre-oxygenation with 100% oxygen Induction Type: IV induction Ventilation: Mask ventilation without difficulty Laryngoscope Size: Miller and 3 Grade View: Grade I Tube type: Oral Tube size: 8.0 mm Number of attempts: 1 Airway Equipment and Method: Stylet and Oral airway Placement Confirmation: ETT inserted through vocal cords under direct vision,  positive ETCO2 and breath sounds checked- equal and bilateral Secured at: 22 cm Tube secured with: Tape Dental Injury: Teeth and Oropharynx as per pre-operative assessment

## 2019-07-01 NOTE — Interval H&P Note (Signed)
History and Physical Interval Note:  07/01/2019 7:43 AM  Alexander Griffith  has presented today for surgery, with the diagnosis of left inguinal hernia.  The various methods of treatment have been discussed with the patient and family. After consideration of risks, benefits and other options for treatment, the patient has consented to  Procedure(s) with comments: LEFT INGUINAL HERNIA WITH MESH (Left) - GENERAL AND TAP BLOCK as a surgical intervention.  The patient's history has been reviewed, patient examined, no change in status, stable for surgery.  I have reviewed the patient's chart and labs.  Questions were answered to the patient's satisfaction.     [place]

## 2019-07-01 NOTE — Anesthesia Procedure Notes (Signed)
Anesthesia Regional Block: TAP block   Pre-Anesthetic Checklist: ,, timeout performed, Correct Patient, Correct Site, Correct Laterality, Correct Procedure, Correct Position, site marked, Risks and benefits discussed,  Surgical consent,  Pre-op evaluation,  At surgeon's request and post-op pain management  Laterality: Left  Prep: chloraprep       Needles:  Injection technique: Single-shot  Needle Type: Echogenic Stimulator Needle     Needle Length: 9cm  Needle Gauge: 21   Needle insertion depth: 7 cm   Additional Needles:   Procedures:,,,, ultrasound used (permanent image in chart),,,,  Narrative:  Start time: 07/01/2019 7:15 AM End time: 07/01/2019 7:20 AM Injection made incrementally with aspirations every 5 mL.  Performed by: Personally  Anesthesiologist: Josephine Igo, MD  Additional Notes: Timeout performed. Patient sedated. Relevant anatomy ID'd using [place]. Incremental 2-43ml injection of LA with frequent aspiration. Patient tolerated procedure well.        Left TAP Block

## 2019-07-01 NOTE — Transfer of Care (Signed)
Immediate Anesthesia Transfer of Care Note  Patient: Alexander Griffith  Procedure(s) Performed: LEFT INGUINAL HERNIA WITH MESH (Left )  Patient Location: PACU  Anesthesia Type:GA combined with regional for post-op pain  Level of Consciousness: awake and patient cooperative  Airway & Oxygen Therapy: Patient Spontanous Breathing and Patient connected to face mask oxygen  Post-op Assessment: Report given to RN and Post -op Vital signs reviewed and stable  Post vital signs: Reviewed and stable  Last Vitals:  Vitals Value Taken Time  BP    Temp    Pulse    Resp    SpO2      Last Pain:  Vitals:   07/01/19 0636  TempSrc: Oral  PainSc: 0-No pain         Complications: No apparent anesthesia complications

## 2019-07-01 NOTE — Progress Notes (Signed)
Assisted Dr. Royce Macadamia with left, ultrasound guided, transabdominal plane block. Side rails up, monitors on throughout procedure. See vital signs in flow sheet. Tolerated Procedure well.

## 2019-07-01 NOTE — Discharge Instructions (Signed)
[facility] _____[facility]  UMBILICAL OR INGUINAL HERNIA REPAIR: POST OP INSTRUCTIONS  Always review your discharge instruction sheet given to you by the facility where your surgery was performed. IF YOU HAVE DISABILITY OR FAMILY LEAVE FORMS, YOU MUST BRING THEM TO THE OFFICE FOR PROCESSING.   DO NOT GIVE THEM TO YOUR DOCTOR.  1. A  prescription for pain medication may be given to you upon discharge.  Take your pain medication as prescribed, if needed.  If narcotic pain medicine is not needed, then you may take acetaminophen (Tylenol) or ibuprofen (Advil) as needed. 2. Take your usually prescribed medications unless otherwise directed. If you need a refill on your pain medication, please contact your pharmacy.  They will contact our office to request authorization. Prescriptions will not be filled after 5 pm or on week-ends. 3. You should follow a light diet the first 24 hours after arrival home, such as soup and crackers, etc.  Be sure to include lots of fluids daily.  Resume your normal diet the day after surgery. 4.Most patients will experience some swelling and bruising around the umbilicus or in the groin and scrotum.  Ice packs and reclining will help.  Swelling and bruising can take several days to resolve.  6. It is common to experience some constipation if taking pain medication after surgery.  Increasing fluid intake and taking a stool softener (such as Colace) will usually help or prevent this problem from occurring.  A mild laxative (Milk of Magnesia or Miralax) should be taken according to package directions if there are no bowel movements after 48 hours. 7. Unless discharge instructions indicate otherwise, you may remove your bandages 24-48 hours after surgery, and you may shower at that time.  You may have steri-strips (small skin tapes) in place directly over the incision.  These strips should be left on the skin for 7-10 days.  If your surgeon used skin glue on the  incision, you may shower in 24 hours.  The glue will flake off over the next 2-3 weeks.  Any sutures or staples will be removed at the office during your follow-up visit. 8. ACTIVITIES:  You may resume regular (light) daily activities beginning the next day--such as daily self-care, walking, climbing stairs--gradually increasing activities as tolerated.  You may have sexual intercourse when it is comfortable.  Refrain from any heavy lifting or straining until approved by your doctor.  a.You may drive when you are no longer taking prescription pain medication, you can comfortably wear a seatbelt, and you can safely maneuver your car and apply brakes. b.RETURN TO WORK:   _____________________________________________  9.You should see your doctor in the office for a follow-up appointment approximately 2-3 weeks after your surgery.  Make sure that you call for this appointment within a day or two after you arrive home to insure a convenient appointment time. 10.OTHER INSTRUCTIONS: _________________________    _____________________________________  WHEN TO CALL YOUR DOCTOR: 1. Fever over 101.0 2. Inability to urinate 3. Nausea and/or vomiting 4. Extreme swelling or bruising 5. Continued bleeding from incision. 6. Increased pain, redness, or drainage from the incision  The clinic staff is available to answer your questions during regular business hours.  Please don't hesitate to call and ask to speak to one of the nurses for clinical concerns.  If you have a medical emergency, go to the nearest emergency room or call 911.  A surgeon from [facility] is always on call at the hospital     [address] [place], [place], [place]  [address] ?  [address], [place], [place]   [id number] [phone] ? [phone] ? FAX [phone] Web site: [email or web address]   Post Anesthesia Home Care Instructions  Activity: Get plenty of rest for the remainder of the day. A  responsible individual must stay with you for 24 hours following the procedure.  For the next 24 hours, DO NOT: -Drive a car -Paediatric nurse -Drink alcoholic beverages -Take any medication unless instructed by your physician -Make any legal decisions or sign important papers.  Meals: Start with liquid foods such as gelatin or soup. Progress to regular foods as tolerated. Avoid greasy, spicy, heavy foods. If nausea and/or vomiting occur, drink only clear liquids until the nausea and/or vomiting subsides. Call your physician if vomiting continues.  Special Instructions/Symptoms: Your throat may feel dry or sore from the anesthesia or the breathing tube placed in your throat during surgery. If this causes discomfort, gargle with warm salt water. The discomfort should disappear within 24 hours.  Information for Discharge Teaching: EXPAREL (bupivacaine liposome injectable suspension)   Your surgeon or anesthesiologist gave you EXPAREL(bupivacaine) to help control your pain after surgery.   EXPAREL is a local anesthetic that provides pain relief by numbing the tissue around the surgical site.  EXPAREL is designed to release pain medication over time and can control pain for up to 72 hours.  Depending on how you respond to EXPAREL, you may require less pain medication during your recovery.  Possible side effects:  Temporary loss of sensation or ability to move in the area where bupivacaine was injected.  Nausea, vomiting, constipation  Rarely, numbness and tingling in your mouth or lips, lightheadedness, or anxiety may occur.  Call your doctor right away if you think you may be experiencing any of these sensations, or if you have other questions regarding possible side effects.  Follow all other discharge instructions given to you by your surgeon or nurse. Eat a healthy diet and drink plenty of water or other fluids.  If you return to the hospital for any reason within 96 hours  following the administration of EXPAREL, it is important for health care providers to know that you have received this anesthetic. A teal colored band has been placed on your arm with the date, time and amount of EXPAREL you have received in order to alert and inform your health care providers. Please leave this armband in place for the full 96 hours following administration, and then you may remove the band.  *May take Tylenol after 12:30pm today 07/01/19

## 2019-07-01 NOTE — Anesthesia Postprocedure Evaluation (Signed)
Anesthesia Post Note  Patient: Alexander Griffith  Procedure(s) Performed: LEFT INGUINAL HERNIA WITH MESH (Left )     Patient location during evaluation: PACU Anesthesia Type: General Level of consciousness: awake and alert and oriented Pain management: pain level controlled Vital Signs Assessment: post-procedure vital signs reviewed and stable Respiratory status: spontaneous breathing, nonlabored ventilation and respiratory function stable Cardiovascular status: blood pressure returned to baseline and stable Postop Assessment: no apparent nausea or vomiting Anesthetic complications: no    Last Vitals:  Vitals:   07/01/19 0930 07/01/19 1000  BP: 136/74 137/86  Pulse: 80 70  Resp: (!) 21 16  Temp:  36.5 C  SpO2: 98% 95%    Last Pain:  Vitals:   07/01/19 1000  TempSrc:   PainSc: 2                  Audie Wieser A.

## 2019-07-02 ENCOUNTER — Encounter: Payer: Self-pay | Admitting: *Deleted

## 2019-11-17 ENCOUNTER — Encounter: Payer: Self-pay | Admitting: Family Medicine

## 2019-11-17 DIAGNOSIS — I10 Essential (primary) hypertension: Secondary | ICD-10-CM

## 2019-11-17 DIAGNOSIS — Z125 Encounter for screening for malignant neoplasm of prostate: Secondary | ICD-10-CM

## 2019-11-17 DIAGNOSIS — E039 Hypothyroidism, unspecified: Secondary | ICD-10-CM

## 2019-11-17 DIAGNOSIS — E785 Hyperlipidemia, unspecified: Secondary | ICD-10-CM

## 2019-12-04 ENCOUNTER — Other Ambulatory Visit: Payer: Medicare Other

## 2019-12-04 ENCOUNTER — Other Ambulatory Visit: Payer: Self-pay

## 2019-12-04 DIAGNOSIS — I10 Essential (primary) hypertension: Secondary | ICD-10-CM

## 2019-12-04 DIAGNOSIS — E785 Hyperlipidemia, unspecified: Secondary | ICD-10-CM

## 2019-12-04 DIAGNOSIS — Z125 Encounter for screening for malignant neoplasm of prostate: Secondary | ICD-10-CM

## 2019-12-04 DIAGNOSIS — E039 Hypothyroidism, unspecified: Secondary | ICD-10-CM

## 2019-12-05 ENCOUNTER — Encounter: Payer: Self-pay | Admitting: Family Medicine

## 2019-12-05 LAB — CBC WITH DIFFERENTIAL/PLATELET
Absolute Monocytes: 348 cells/uL (ref 200–950)
Basophils Absolute: 29 cells/uL (ref 0–200)
Basophils Relative: 0.5 %
Eosinophils Absolute: 110 cells/uL (ref 15–500)
Eosinophils Relative: 1.9 %
HCT: 39.5 % (ref 38.5–50.0)
Hemoglobin: 13.1 g/dL — ABNORMAL LOW (ref 13.2–17.1)
Lymphs Abs: 2987 cells/uL (ref 850–3900)
MCH: 32 pg (ref 27.0–33.0)
MCHC: 33.2 g/dL (ref 32.0–36.0)
MCV: 96.3 fL (ref 80.0–100.0)
MPV: 10.5 fL (ref 7.5–12.5)
Monocytes Relative: 6 %
Neutro Abs: 2326 cells/uL (ref 1500–7800)
Neutrophils Relative %: 40.1 %
Platelets: 230 10*3/uL (ref 140–400)
RBC: 4.1 10*6/uL — ABNORMAL LOW (ref 4.20–5.80)
RDW: 12 % (ref 11.0–15.0)
Total Lymphocyte: 51.5 %
WBC: 5.8 10*3/uL (ref 3.8–10.8)

## 2019-12-05 LAB — PSA: PSA: 0.83 ng/mL (ref <=4.0)

## 2019-12-05 LAB — COMPLETE METABOLIC PANEL WITH GFR
AG Ratio: 1.7 (calc) (ref 1.0–2.5)
ALT: 12 U/L (ref 9–46)
AST: 16 U/L (ref 10–35)
Albumin: 4 g/dL (ref 3.6–5.1)
Alkaline phosphatase (APISO): 56 U/L (ref 35–144)
BUN: 16 mg/dL (ref 7–25)
CO2: 28 mmol/L (ref 20–32)
Calcium: 8.7 mg/dL (ref 8.6–10.3)
Chloride: 105 mmol/L (ref 98–110)
Creat: 0.98 mg/dL (ref 0.70–1.25)
GFR, Est African American: 91 mL/min/{1.73_m2} (ref >=60)
GFR, Est Non African American: 79 mL/min/{1.73_m2} (ref >=60)
Globulin: 2.3 g/dL (calc) (ref 1.9–3.7)
Glucose, Bld: 103 mg/dL — ABNORMAL HIGH (ref 65–99)
Potassium: 3.9 mmol/L (ref 3.5–5.3)
Sodium: 139 mmol/L (ref 135–146)
Total Bilirubin: 0.4 mg/dL (ref 0.2–1.2)
Total Protein: 6.3 g/dL (ref 6.1–8.1)

## 2019-12-05 LAB — LIPID PANEL (REFL)
Cholesterol: 169 mg/dL (ref <200)
HDL: 63 mg/dL (ref >=40)
LDL Cholesterol (Calc): 87 mg/dL (calc)
Non-HDL Cholesterol (Calc): 106 mg/dL (calc) (ref <130)
Total CHOL/HDL Ratio: 2.7 (calc) (ref <5.0)
Triglycerides: 96 mg/dL (ref <150)

## 2019-12-05 LAB — TSH: TSH: 4.32 mIU/L (ref 0.40–4.50)

## 2019-12-07 NOTE — Patient Instructions (Addendum)
Health Maintenance Due  Topic Date Due  . INFLUENZA VACCINE - high dose flu shot today  covid booster in 4-6 weeks at pharmacy 10/04/2019   We will call you within two weeks about your referral to GI for trouble swallowing. If you do not hear within 3 weeks, give [place] a call. Id even ask if they could set up colonoscopy same time since so close to 10 years (not promising they can but worth asking)  Lets set a goal of 5-10 lbs off by next year physical

## 2019-12-07 NOTE — Progress Notes (Signed)
Phone: [phone]   Subjective:  Patient presents today for their annual physical. Chief complaint-noted.   See problem oriented charting- ROS- full  review of systems was completed and negative  except for: Occasional sensation of food getting stuck in the throat  The following were reviewed and entered/updated in epic: Past Medical History:  Diagnosis Date  . Bilateral cataracts 09/19/2017  . Colon polyps    1990s  . Diverticulosis of sigmoid colon    noted on 06/2010 colonoscopy.  [name] History of skin cancer    skin  . HSV infection    acyclovir for cold sores  . Hyperlipidemia   . Hypertension   . Hypothyroidism    Patient Active Problem List   Diagnosis Date Noted  . Ocular migraine 06/01/2019    Priority: Medium  . Hyperglycemia 12/04/2018    Priority: Medium  . HSV infection     Priority: Medium  . Hyperlipemia 04/04/2007    Priority: Medium  . Hypothyroidism 12/06/2006    Priority: Medium  . Essential hypertension 12/06/2006    Priority: Medium  . SKIN CANCER, HX OF 12/06/2006    Priority: Medium  . Dupuytren contracture 01/22/2018    Priority: Low  . HIP PAIN, BILATERAL 04/04/2007    Priority: Low  . History of colonic polyps 12/06/2006    Priority: Low   Past Surgical History:  Procedure Laterality Date  . ACHILLES TENDON REPAIR    . ANTERIOR CRUCIATE LIGAMENT REPAIR     right, 09  . BASAL CELL CARCINOMA EXCISION     05/2010  . CATARACT EXTRACTION Bilateral 2019  . COLONOSCOPY    . Dupuytren's Contracture Bilateral   . INGUINAL HERNIA REPAIR Left 07/01/2019   Procedure: LEFT INGUINAL HERNIA WITH MESH;  Surgeon: Erroll Luna, MD;  Location: [facility];  Service: General;  Laterality: Left;  GENERAL AND TAP BLOCK  . POLYPECTOMY     in 1990s, adenomatous colon polyp  . SHOULDER ARTHROSCOPY Left 02/09/2019    Family History  Problem Relation Age of Onset  . Colon cancer Paternal Grandfather   . Hypertension Mother        died  at 39  . Stroke Maternal Grandfather   . Heart disease Father        heart attack 55, smoking  . Lung cancer Father        2 years after heart attack  . Other Daughter        accidental death- high level delsym ingredient    Medications- reviewed and updated Current Outpatient Medications  Medication Sig Dispense Refill  . acyclovir (ZOVIRAX) 400 MG tablet Take 1 tablet (400 mg total) by mouth 2 (two) times daily as needed (cold sores). 180 tablet 3  . aspirin 81 MG tablet Take 81 mg by mouth daily.      . Biotin 300 MCG TABS Take by mouth 2 (two) times daily.      [name] co-enzyme Q-10 30 MG capsule Take 30 mg by mouth daily.      . diphenhydrAMINE (BENADRYL ALLERGY) 25 mg capsule     . fish oil-omega-3 fatty acids 1000 MG capsule Take 1 g by mouth daily.    [name] glucosamine-chondroitin 500-400 MG tablet Take 2 tablets by mouth daily.     [name] levothyroxine (SYNTHROID) 100 MCG tablet Take 1 tablet (100 mcg total) by mouth daily. 100 tablet 4  . losartan (COZAAR) 50 MG tablet Take 1 tablet (50 mg total) by mouth daily. 100  tablet 4  . simvastatin (ZOCOR) 20 MG tablet Take 1 tablet (20 mg total) by mouth every evening. 100 tablet 4   No current facility-administered medications for this visit.    Allergies-reviewed and updated No Known Allergies  Social History   Social History Narrative   Married since 1974. Lost oldest daughter in 2008. 2 living children- 2 grandkids both in town- they keep the youngest 69 years old in 2019.       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 Held back some- because had to help wife after rotator cuff surgery.    Objective  Objective:  BP 124/80   Pulse 65   Temp 97.8 F (36.6 C) (Temporal)   Resp 18   Ht 5\' 9"  (1.753 m)   Wt 192 lb 12.8 oz (87.5 kg)   SpO2 97%   BMI 28.47 kg/m  Gen: NAD, resting comfortably HEENT: Mucous membranes are  moist. Oropharynx normal Neck: no thyromegaly CV: RRR no murmurs rubs or gallops Lungs: CTAB no crackles, wheeze, rhonchi Abdomen: soft/nontender/nondistended/normal bowel sounds. No rebound or guarding.  Ext: no edema Skin: warm, dry Neuro: grossly normal, moves all extremities, PERRLA   Assessment and Plan  69 y.o. male presenting for annual physical.  Health Maintenance counseling: 1. Anticipatory guidance: Patient counseled regarding regular dental exams q6 months, eye exams yearly,  avoiding smoking and second hand smoke, limiting alcohol to 2 beverages per day.   2. Risk factor reduction:  Advised patient of need for regular exercise and diet rich and fruits and vegetables to reduce risk of heart attack and stroke. Exercise- Does ride his bike regularly. Diet-has a sweet tooth and feels he could cut back- had gotten to 182 on home scales and slipped some to 188- mainly chicken and fish but occasional steak.  Wt Readings from Last 3 Encounters:  12/08/19 192 lb 12.8 oz (87.5 kg)  07/01/19 188 lb 15 oz (85.7 kg)  06/01/19 190 lb 9.6 oz (86.5 kg)  3. Immunizations/screenings/ancillary studies - high dose flu today. covid booster 4-6 weeks covid booster.  Immunization History  Administered Date(s) Administered  . Fluad Quad(high Dose 65+) 11/26/2018  . Influenza Whole 12/03/2008  . Influenza, High Dose Seasonal PF 11/26/2016, 12/02/2017  . Influenza,inj,Quad PF,6+ Mos 11/22/2015  . Influenza,inj,quad, With Preservative 12/18/2016  . PFIZER SARS-COV-2 Vaccination 04/09/2019, 05/05/2019  . Pneumococcal Conjugate-13 11/26/2016  . Pneumococcal Polysaccharide-23 12/02/2017  . Td 04/06/2008  . Tdap 09/01/2018  . Zoster 05/31/2011  . Zoster Recombinat (Shingrix) 07/16/2017, 09/16/2017  4. Prostate cancer screening- low risk based off psa trend  Lab Results  Component Value Date   PSA 0.83 12/04/2019   PSA 1.17 11/26/2018   PSA 1.25 11/27/2017   5. Colon cancer screening - 4.9.2012  due next year. On recall list.  6. Skin cancer screening- has seen in past but not recently. advised regular sunscreen use. Denies worrisome, changing, or new skin lesions.  7. Never smoker 8. STD screening - monogamous  Status of chronic or acute concerns   #surgeries-  hernia surgery in march and then last December had shoulder worked on.   # Swallowing Concerns S:Patient mentioned that occassionally he notices that food happens to get stuck in his throat, he denies choking. He stated that it doesn't happen every time he eats. He just wanted to make [place] aware. He pauses and gets better.  No issues with fluids. More common with chicken. Does eat quickly. Year of symptoms- perhaps once a month  Mother in her 65s had to have swallow study.   A/P: dysphagia of unclear cause- recommended GI referral and possible EGD   #hypertension S: medication: Losartan 50 mg A/P: Stable. Continue current medications.    #hyperlipidemia S: Medication:Simvastatin 20 mg, aspirin for primary prevention Lab Results  Component Value Date   CHOL 169 12/04/2019   HDL 63 12/04/2019   LDLCALC 87 12/04/2019   TRIG 96 12/04/2019   CHOLHDL 2.7 12/04/2019  A/P: LDL slightly above goal of 70 or less at 87-due to potential side effects would prefer to focus on diet and exercise-we discussed setting a goal of at least 5 to 10 pound weight loss by next years physical and reassess at that time  #hypothyroidism S: compliant On thyroid medication-levothyroxine 100 mcg A/P:Stable. Continue current medications.  Lab Results  Component Value Date   TSH 4.32 12/04/2019   # Hyperglycemia-fasting sugars low 100s but last A1c 2016 5.2 S:  Medication: none Exercise and diet- see above A/P: Fasting blood sugars have been slightly elevated but A1c's have been normal in the past-we will likely check another A1c sometime in the next year since last A1c was in 2016  #Cold sores-acyclovir as needed/sparing use  #History of  skin cancer-has seen [facility]ology in the past- has not been lately   #History of colon polyps-3 on first colonoscopy but clear since that time and back to 10-year colonoscopy regimen  #Ocular migraine-diagnosed by ophthalmology March 2021 severe floater x2 at that time- no recurrence lately  Recommended follow up: Return in about 1 year (around 12/07/2020) for physical or sooner if needed. as long as monitors weight and BP at home   Lab/Order associations: Already completed labs   ICD-10-CM   1. Preventative health care  Z00.00   2. Essential hypertension  I10   3. Hypothyroidism, unspecified type  E03.9   4. Hyperglycemia  R73.9   5. Hyperlipidemia, unspecified hyperlipidemia type  E78.5   6. Dysphagia, unspecified type  R13.10 Ambulatory referral to Gastroenterology    Meds ordered this encounter  Medications  . levothyroxine (SYNTHROID) 100 MCG tablet    Sig: Take 1 tablet (100 mcg total) by mouth daily.    Dispense:  100 tablet    Refill:  4  . losartan (COZAAR) 50 MG tablet    Sig: Take 1 tablet (50 mg total) by mouth daily.    Dispense:  100 tablet    Refill:  4  . simvastatin (ZOCOR) 20 MG tablet    Sig: Take 1 tablet (20 mg total) by mouth every evening.    Dispense:  100 tablet    Refill:  4  . acyclovir (ZOVIRAX) 400 MG tablet    Sig: Take 1 tablet (400 mg total) by mouth 2 (two) times daily as needed (cold sores).    Dispense:  180 tablet    Refill:  3    Return precautions advised.  Garret Reddish, MD

## 2019-12-08 ENCOUNTER — Encounter: Payer: Self-pay | Admitting: Family Medicine

## 2019-12-08 ENCOUNTER — Other Ambulatory Visit: Payer: Self-pay

## 2019-12-08 ENCOUNTER — Ambulatory Visit (INDEPENDENT_AMBULATORY_CARE_PROVIDER_SITE_OTHER): Payer: Medicare Other | Admitting: Family Medicine

## 2019-12-08 VITALS — BP 124/80 | HR 65 | Temp 97.8°F | Resp 18 | Ht 69.0 in | Wt 192.8 lb

## 2019-12-08 DIAGNOSIS — E785 Hyperlipidemia, unspecified: Secondary | ICD-10-CM

## 2019-12-08 DIAGNOSIS — R739 Hyperglycemia, unspecified: Secondary | ICD-10-CM

## 2019-12-08 DIAGNOSIS — Z23 Encounter for immunization: Secondary | ICD-10-CM

## 2019-12-08 DIAGNOSIS — I10 Essential (primary) hypertension: Secondary | ICD-10-CM | POA: Diagnosis not present

## 2019-12-08 DIAGNOSIS — E039 Hypothyroidism, unspecified: Secondary | ICD-10-CM | POA: Diagnosis not present

## 2019-12-08 DIAGNOSIS — Z0000 Encounter for general adult medical examination without abnormal findings: Secondary | ICD-10-CM | POA: Diagnosis not present

## 2019-12-08 DIAGNOSIS — R1310 Dysphagia, unspecified: Secondary | ICD-10-CM

## 2019-12-08 MED ORDER — SIMVASTATIN 20 MG PO TABS: 20.0000 mg | ORAL_TABLET | Freq: Every evening | ORAL | 4 refills | Status: DC

## 2019-12-08 MED ORDER — LEVOTHYROXINE SODIUM 100 MCG PO TABS: 100.0000 ug | ORAL_TABLET | Freq: Every day | ORAL | 4 refills | Status: DC

## 2019-12-08 MED ORDER — LOSARTAN POTASSIUM 50 MG PO TABS: 50.0000 mg | ORAL_TABLET | Freq: Every day | ORAL | 4 refills | Status: DC

## 2019-12-08 MED ORDER — ACYCLOVIR 400 MG PO TABS: 400.0000 mg | ORAL_TABLET | Freq: Two times a day (BID) | ORAL | 3 refills | Status: DC | PRN

## 2019-12-08 NOTE — Addendum Note (Signed)
Addended by: Thomes Cake on: 12/08/2019 09:03 AM   Modules accepted: Orders

## 2020-01-01 ENCOUNTER — Other Ambulatory Visit: Payer: Self-pay

## 2020-01-01 ENCOUNTER — Ambulatory Visit (INDEPENDENT_AMBULATORY_CARE_PROVIDER_SITE_OTHER): Payer: Medicare Other

## 2020-01-01 DIAGNOSIS — Z0000 Encounter for general adult medical examination without abnormal findings: Secondary | ICD-10-CM | POA: Diagnosis not present
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
## 2020-01-01 NOTE — Patient Instructions (Addendum)
Mr. Alexander Griffith , Thank you for taking time to come for your Medicare Wellness Visit. I appreciate your ongoing commitment to your health goals. Please review the following plan we discussed and let me know if I can assist you in the future.   Screening recommendations/referrals: Colonoscopy: Done 06/12/10 Recommended yearly ophthalmology/optometry visit for glaucoma screening and checkup Recommended yearly dental visit for hygiene and checkup  Vaccinations: Influenza vaccine: Up to date Done 12/08/19 Pneumococcal vaccine: Up to date Tdap vaccine: Up to date Shingles vaccine: Completed 5/14 & 09/16/17   Covid-19: Completed 2/4 & 05/05/19  Advanced directives: Please bring a copy of your health care power of attorney and living will to the office at your convenience.   Conditions/risks identified: Lose weight  Next appointment: Follow up in one year for your annual wellness visit.   Preventive Care 69 Years and Older, Male Preventive care refers to lifestyle choices and visits with your health care provider that can promote health and wellness. What does preventive care include?  A yearly physical exam. This is also called an annual well check.  Dental exams once or twice a year.  Routine eye exams. Ask your health care provider how often you should have your eyes checked.  Personal lifestyle choices, including:  Daily care of your teeth and gums.  Regular physical activity.  Eating a healthy diet.  Avoiding tobacco and drug use.  Limiting alcohol use.  Practicing safe sex.  Taking low doses of aspirin every day.  Taking vitamin and mineral supplements as recommended by your health care provider. What happens during an annual well check? The services and screenings done by your health care provider during your annual well check will depend on your age, overall health, lifestyle risk factors, and family history of disease. Counseling  Your health care provider may ask you  questions about your:  Alcohol use.  Tobacco use.  Drug use.  Emotional well-being.  Home and relationship well-being.  Sexual activity.  Eating habits.  History of falls.  Memory and ability to understand (cognition).  Work and work Statistician. Screening  You may have the following tests or measurements:  Height, weight, and BMI.  Blood pressure.  Lipid and cholesterol levels. These may be checked every 5 years, or more frequently if you are over 38 years old.  Skin check.  Lung cancer screening. You may have this screening every year starting at age 69 if you have a 30-pack-year history of smoking and currently smoke or have quit within the past 15 years.  Fecal occult blood test (FOBT) of the stool. You may have this test every year starting at age 55.  Flexible sigmoidoscopy or colonoscopy. You may have a sigmoidoscopy every 5 years or a colonoscopy every 10 years starting at age 60.  Prostate cancer screening. Recommendations will vary depending on your family history and other risks.  Hepatitis C blood test.  Hepatitis B blood test.  Sexually transmitted disease (STD) testing.  Diabetes screening. This is done by checking your blood sugar (glucose) after you have not eaten for a while (fasting). You may have this done every 1-3 years.  Abdominal aortic aneurysm (AAA) screening. You may need this if you are a current or former smoker.  Osteoporosis. You may be screened starting at age 38 if you are at high risk. Talk with your health care provider about your test results, treatment options, and if necessary, the need for more tests. Vaccines  Your health care provider may recommend  certain vaccines, such as:  Influenza vaccine. This is recommended every year.  Tetanus, diphtheria, and acellular pertussis (Tdap, Td) vaccine. You may need a Td booster every 10 years.  Zoster vaccine. You may need this after age 52.  Pneumococcal 13-valent conjugate  (PCV13) vaccine. One dose is recommended after age 56.  Pneumococcal polysaccharide (PPSV23) vaccine. One dose is recommended after age 34. Talk to your health care provider about which screenings and vaccines you need and how often you need them. This information is not intended to replace advice given to you by your health care provider. Make sure you discuss any questions you have with your health care provider. Document Released: 03/18/2015 Document Revised: 11/09/2015 Document Reviewed: 12/21/2014 Elsevier Interactive Patient Education  2017 [place] Prevention in the Home Falls can cause injuries. They can happen to people of all ages. There are many things you can do to make your home safe and to help prevent falls. What can I do on the outside of my home?  Regularly fix the edges of walkways and driveways and fix any cracks.  Remove anything that might make you trip as you walk through a door, such as a raised step or threshold.  Trim any bushes or trees on the path to your home.  Use bright outdoor lighting.  Clear any walking paths of anything that might make someone trip, such as rocks or tools.  Regularly check to see if handrails are loose or broken. Make sure that both sides of any steps have handrails.  Any raised decks and porches should have guardrails on the edges.  Have any leaves, snow, or ice cleared regularly.  Use sand or salt on walking paths during winter.  Clean up any spills in your garage right away. This includes oil or grease spills. What can I do in the bathroom?  Use night lights.  Install grab bars by the toilet and in the tub and shower. Do not use towel bars as grab bars.  Use non-skid mats or decals in the tub or shower.  If you need to sit down in the shower, use a plastic, non-slip stool.  Keep the floor dry. Clean up any water that spills on the floor as soon as it happens.  Remove soap buildup in the tub or shower  regularly.  Attach bath mats securely with double-sided non-slip rug tape.  Do not have throw rugs and other things on the floor that can make you trip. What can I do in the bedroom?  Use night lights.  Make sure that you have a light by your bed that is easy to reach.  Do not use any sheets or blankets that are too big for your bed. They should not hang down onto the floor.  Have a firm chair that has side arms. You can use this for support while you get dressed.  Do not have throw rugs and other things on the floor that can make you trip. What can I do in the kitchen?  Clean up any spills right away.  Avoid walking on wet floors.  Keep items that you use a lot in easy-to-reach places.  If you need to reach something above you, use a strong step stool that has a grab bar.  Keep electrical cords out of the way.  Do not use floor polish or wax that makes floors slippery. If you must use wax, use non-skid floor wax.  Do not have throw rugs and other  things on the floor that can make you trip. What can I do with my stairs?  Do not leave any items on the stairs.  Make sure that there are handrails on both sides of the stairs and use them. Fix handrails that are broken or loose. Make sure that handrails are as long as the stairways.  Check any carpeting to make sure that it is firmly attached to the stairs. Fix any carpet that is loose or worn.  Avoid having throw rugs at the top or bottom of the stairs. If you do have throw rugs, attach them to the floor with carpet tape.  Make sure that you have a light switch at the top of the stairs and the bottom of the stairs. If you do not have them, ask someone to add them for you. What else can I do to help prevent falls?  Wear shoes that:  Do not have high heels.  Have rubber bottoms.  Are comfortable and fit you well.  Are closed at the toe. Do not wear sandals.  If you use a stepladder:  Make sure that it is fully  opened. Do not climb a closed stepladder.  Make sure that both sides of the stepladder are locked into place.  Ask someone to hold it for you, if possible.  Clearly mark and make sure that you can see:  Any grab bars or handrails.  First and last steps.  Where the edge of each step is.  Use tools that help you move around (mobility aids) if they are needed. These include:  Canes.  Walkers.  Scooters.  Crutches.  Turn on the lights when you go into a dark area. Replace any light bulbs as soon as they burn out.  Set up your furniture so you have a clear path. Avoid moving your furniture around.  If any of your floors are uneven, fix them.  If there are any pets around you, be aware of where they are.  Review your medicines with your doctor. Some medicines can make you feel dizzy. This can increase your chance of falling. Ask your doctor what other things that you can do to help prevent falls. This information is not intended to replace advice given to you by your health care provider. Make sure you discuss any questions you have with your health care provider. Document Released: 12/16/2008 Document Revised: 07/28/2015 Document Reviewed: 03/26/2014 Elsevier Interactive Patient Education  2017 [employer].

## 2020-02-10 ENCOUNTER — Encounter: Payer: Self-pay | Admitting: Gastroenterology

## 2020-02-10 ENCOUNTER — Ambulatory Visit (INDEPENDENT_AMBULATORY_CARE_PROVIDER_SITE_OTHER): Payer: Medicare Other | Admitting: Gastroenterology

## 2020-02-10 VITALS — BP 124/74 | HR 72 | Ht 68.0 in | Wt 192.2 lb

## 2020-02-10 DIAGNOSIS — Z86010 Personal history of colonic polyps: Secondary | ICD-10-CM | POA: Diagnosis not present

## 2020-02-10 DIAGNOSIS — R1310 Dysphagia, unspecified: Secondary | ICD-10-CM

## 2020-02-10 MED ORDER — SUPREP BOWEL PREP KIT 17.5-3.13-1.6 GM/177ML PO SOLN: 1.0000 | ORAL | 0 refills | Status: DC

## 2020-02-10 NOTE — Patient Instructions (Addendum)
I have recommended an upper endoscopy with possible dilation to evaluate your swallowing changes.  We will proceed with your colonoscopy at the same time.  Tips for colonoscopy:  - Stay well hydrated for 3-4 days prior to the exam. This reduces nausea and dehydration.  - To prevent skin/hemorrhoid irritation - prior to wiping, put A&Dointment or vaseline on the toilet paper. - Keep a towel or pad on the bed.  - Drink  64oz of clear liquids in the morning of prep day (prior to starting the prep) to be sure that there is enough fluid to flush the colon and stay hydrated!!!! This is in addition to the fluids required for preparation. - Use of a flavored hard candy, such as grape Anise Salvo, can counteract some of the flavor of the prep and may prevent some nausea.   ENDOSCOPY AND COLONOSCOPY: You have been scheduled for a endoscopy and colonoscopy. Please follow written instructions given to you at your visit today.   PREP: Please pick up your prep supplies at the pharmacy within the next 1-3 days.  INHALERS: If you use inhalers (even only as needed), please bring them with you on the day of your procedure.  If you are age 13 or older, your body mass index should be between 23-30. Your There is no height or weight on file to calculate BMI. If this is out of the aforementioned range listed, please consider follow up with your Primary Care Provider.  Thank you for trusting me with your gastrointestinal care!    Thornton Park, MD, MPH

## 2020-02-10 NOTE — Progress Notes (Signed)
Referring Provider: Marin Olp, MD Primary Care Physician:  Marin Olp, MD  Reason for Consultation:  Dysphagia   IMPRESSION:  Dysphagia to solids in the setting of reflux History of polyps 1990s and 2003, last colonoscopy 10 years ago Paternal grandfather with colon cancer in his 53s  Dysphagia: Differential includes GERD-related dysmotility, reflux esophagitis +/- stricture, eosinophilic esophagitis, ring, web, malignancy and less likely infectious esophagitis. EGD recommended for evaluation.   Surveillance colonoscopy: Due surveillance colonoscopy 2022. Will schedule concurrently with EGD.   PLAN: EGD with possible dilation +/- biopsies Colonoscopy  Please see the "Patient Instructions" section for addition details about the plan.  HPI: Alexander Griffith is a 69 y.o. male referred by Dr. Yong Channel for further evaluation of dysphagia. He has a history of hypothyroidism on stable doses levothyroxine. He is well known to [facility].  Multiple colonoscopies for colon cancer screening, with an adenoma in the 1990s and again in colonoscopy in 2003. Last colonoscopy performed by Dr. Sharlett Iles 2012 showed sigmoid diverticulosis without polyps.  Seen by our practice while hospitalized with ischemic colitis in 2013. No significant GI symptoms since that time until the last year.   Has had 3 episodes of solid food dysphagia over the last year. Clears with pausing while eating. No odynophagia.  Has a history of heartburn and frequent eructation. Will use Tums PRN.  Wife is particularly concerned.  No neck pain, sore throat, cough, dysphonia, chest pain, blood in the stool, or change in bowel habits. Appetite is good. Weight is stable.   His 65 year old son has required esophageal dilation, etiology unclear. Paternal grandfather with colon cancer in his 69s.   No other known family history of colon cancer or polyps. No family history of uterine/endometrial cancer,  pancreatic cancer or gastric/stomach cancer.   Past Medical History:  Diagnosis Date  . Bilateral cataracts 09/19/2017  . Colon polyps    1990s  . Diverticulosis of sigmoid colon    noted on 06/2010 colonoscopy.  [name] History of skin cancer    skin  . HSV infection    acyclovir for cold sores  . Hyperlipidemia   . Hypertension   . Hypothyroidism     Past Surgical History:  Procedure Laterality Date  . ACHILLES TENDON REPAIR    . ANTERIOR CRUCIATE LIGAMENT REPAIR     right, 09  . BASAL CELL CARCINOMA EXCISION     05/2010  . CATARACT EXTRACTION Bilateral 2019  . COLONOSCOPY    . Dupuytren's Contracture Bilateral   . INGUINAL HERNIA REPAIR Left 07/01/2019   Procedure: LEFT INGUINAL HERNIA WITH MESH;  Surgeon: Erroll Luna, MD;  Location: [facility];  Service: General;  Laterality: Left;  GENERAL AND TAP BLOCK  . POLYPECTOMY     in 1990s, adenomatous colon polyp  . SHOULDER ARTHROSCOPY Left 02/09/2019    Current Outpatient Medications  Medication Sig Dispense Refill  . acyclovir (ZOVIRAX) 400 MG tablet Take 1 tablet (400 mg total) by mouth 2 (two) times daily as needed (cold sores). 180 tablet 3  . aspirin 81 MG tablet Take 81 mg by mouth daily.      . Biotin 300 MCG TABS Take by mouth 2 (two) times daily.      [name] co-enzyme Q-10 30 MG capsule Take 30 mg by mouth daily.      . diphenhydrAMINE (BENADRYL ALLERGY) 25 mg capsule     . fish oil-omega-3 fatty acids 1000 MG capsule Take 1 g  by mouth daily.    [name] glucosamine-chondroitin 500-400 MG tablet Take 2 tablets by mouth daily.     [name] levothyroxine (SYNTHROID) 100 MCG tablet Take 1 tablet (100 mcg total) by mouth daily. 100 tablet 4  . losartan (COZAAR) 50 MG tablet Take 1 tablet (50 mg total) by mouth daily. 100 tablet 4  . simvastatin (ZOCOR) 20 MG tablet Take 1 tablet (20 mg total) by mouth every evening. 100 tablet 4   No current facility-administered medications for this visit.    Allergies as of 02/10/2020   . (No Known Allergies)    Family History  Problem Relation Age of Onset  . Colon cancer Paternal Grandfather   . Hypertension Mother        died at 68  . Stroke Maternal Grandfather   . Heart disease Father        heart attack 55, smoking  . Lung cancer Father        2 years after heart attack  . Other Daughter        accidental death- high level delsym ingredient    Social History   Socioeconomic History  . Marital status: Married    Spouse name: Not on file  . Number of children: 2  . Years of education: Not on file  . Highest education level: Not on file  Occupational History  . Occupation: Aeronautical engineer: [employer]    Comment: Retired  Tobacco Use  . Smoking status: Never Smoker  . Smokeless tobacco: Never Used  Vaping Use  . Vaping Use: Never used  Substance and Sexual Activity  . Alcohol use: Yes    Comment: occ. social alcohol  . Drug use: No  . Sexual activity: Yes    Partners: Female  Other Topics Concern  . Not on file  Social History Narrative   Married since 1974. Lost oldest daughter in 2008. 2 living children- 2 grandkids both in town- they keep the youngest 69 years old in 2019.       Retired since 2017- [place]: fish, skiing on boat- [employer] in [place], working out- [employer] through [employer]. Held back some- because had to help wife after rotator cuff surgery.    Social Determinants of Health   Financial Resource Strain: Low Risk   . Difficulty of Paying Living Expenses: Not hard at all  Food Insecurity: No Food Insecurity  . Worried About Charity fundraiser in the Last Year: Never true  . Ran Out of Food in the Last Year: Never true  Transportation Needs: No Transportation Needs  . Lack of Transportation (Medical): No  . Lack of Transportation (Non-Medical): No  Physical Activity: Sufficiently Active  . Days of Exercise per Week: 5 days  . Minutes of Exercise per Session:  120 min  Stress: Stress Concern Present  . Feeling of Stress : To some extent  Social Connections: Moderately Integrated  . Frequency of Communication with Friends and Family: More than three times a week  . Frequency of Social Gatherings with Friends and Family: Twice a week  . Attends Religious Services: More than 4 times per year  . Active Member of Clubs or Organizations: No  . Attends Archivist Meetings: Never  . Marital Status: Married  Human resources officer Violence: Not At Risk  . Fear of Current or Ex-Partner: No  . Emotionally Abused: No  . Physically Abused: No  .  Sexually Abused: No    Review of Systems: 12 system ROS is negative except as noted above.   Physical Exam: General:   Alert,  well-nourished, pleasant and cooperative in NAD Head:  Normocephalic and atraumatic. Eyes:  Sclera clear, no icterus.   Conjunctiva pink. Ears:  Normal auditory acuity. Nose:  No deformity, discharge,  or lesions. Mouth:  No deformity or lesions.   Neck:  Supple; no masses or thyromegaly. Lungs:  Clear throughout to auscultation.   No wheezes. Heart:  Regular rate and rhythm; no murmurs. Abdomen:  Soft,nontender, nondistended, normal bowel sounds, no rebound or guarding. No hepatosplenomegaly.   Rectal:  Deferred  Msk:  Symmetrical. No boney deformities LAD: No inguinal or umbilical LAD Extremities:  No clubbing or edema. Neurologic:  Alert and  oriented x4;  grossly nonfocal Skin:  Intact without significant lesions or rashes. Psych:  Alert and cooperative. Normal mood and affect.    Nghia Mcentee L. Tarri Glenn, MD, MPH 02/10/2020, 3:13 PM

## 2020-04-07 ENCOUNTER — Other Ambulatory Visit: Payer: Self-pay

## 2020-04-07 ENCOUNTER — Ambulatory Visit (AMBULATORY_SURGERY_CENTER): Payer: Medicare Other | Admitting: Gastroenterology

## 2020-04-07 ENCOUNTER — Encounter: Payer: Self-pay | Admitting: Gastroenterology

## 2020-04-07 VITALS — BP 143/90 | HR 53 | Temp 97.2°F | Resp 23 | Ht 68.0 in | Wt 192.0 lb

## 2020-04-07 DIAGNOSIS — K2970 Gastritis, unspecified, without bleeding: Secondary | ICD-10-CM

## 2020-04-07 DIAGNOSIS — K2980 Duodenitis without bleeding: Secondary | ICD-10-CM | POA: Diagnosis not present

## 2020-04-07 DIAGNOSIS — D122 Benign neoplasm of ascending colon: Secondary | ICD-10-CM | POA: Diagnosis not present

## 2020-04-07 DIAGNOSIS — K2950 Unspecified chronic gastritis without bleeding: Secondary | ICD-10-CM | POA: Diagnosis not present

## 2020-04-07 DIAGNOSIS — K3189 Other diseases of stomach and duodenum: Secondary | ICD-10-CM

## 2020-04-07 DIAGNOSIS — K635 Polyp of colon: Secondary | ICD-10-CM | POA: Diagnosis not present

## 2020-04-07 DIAGNOSIS — Z86010 Personal history of colonic polyps: Secondary | ICD-10-CM

## 2020-04-07 DIAGNOSIS — R1310 Dysphagia, unspecified: Secondary | ICD-10-CM | POA: Diagnosis not present

## 2020-04-07 DIAGNOSIS — D123 Benign neoplasm of transverse colon: Secondary | ICD-10-CM

## 2020-04-07 DIAGNOSIS — K2100 Gastro-esophageal reflux disease with esophagitis, without bleeding: Secondary | ICD-10-CM

## 2020-04-07 MED ORDER — SODIUM CHLORIDE 0.9 % IV SOLN: 500.0000 mL | Freq: Once | INTRAVENOUS | Status: DC

## 2020-04-07 MED ORDER — PANTOPRAZOLE SODIUM 40 MG PO TBEC: DELAYED_RELEASE_TABLET | ORAL | 0 refills | Status: DC

## 2020-04-07 NOTE — Op Note (Addendum)
[facility] Patient Name: Alexander Griffith Procedure Date: 04/07/2020 1:45 PM MRN: [id number] Endoscopist: Thornton Park MD, MD Age: 70 Referring MD:  Date of Birth: 04/27/1950 Gender: Male Account #: [id number] Procedure:                Colonoscopy Indications:              High risk colon cancer surveillance: Personal                            history of colonic polyps                           History of polyps 1990s and 2003, last colonoscopy                            10 years ago                           Paternal grandfather with colon cancer in his 31s Medicines:                Monitored Anesthesia Care Procedure:                Pre-Anesthesia Assessment:                           - Prior to the procedure, a History and Physical                            was performed, and patient medications and                            allergies were reviewed. The patient's tolerance of                            previous anesthesia was also reviewed. The risks                            and benefits of the procedure and the sedation                            options and risks were discussed with the patient.                            All questions were answered, and informed consent                            was obtained. Prior Anticoagulants: The patient has                            taken no previous anticoagulant or antiplatelet                            agents. ASA Grade Assessment: II - A patient with  mild systemic disease. After reviewing the risks                            and benefits, the patient was deemed in                            satisfactory condition to undergo the procedure.                           - Prior to the procedure, a History and Physical                            was performed, and patient medications and                            allergies were reviewed. The patient's tolerance of                             previous anesthesia was also reviewed. The risks                            and benefits of the procedure and the sedation                            options and risks were discussed with the patient.                            All questions were answered, and informed consent                            was obtained. Prior Anticoagulants: The patient has                            taken no previous anticoagulant or antiplatelet                            agents. ASA Grade Assessment: II - A patient with                            mild systemic disease. After reviewing the risks                            and benefits, the patient was deemed in                            satisfactory condition to undergo the procedure.                           After obtaining informed consent, the colonoscope                            was passed under direct vision. Throughout the  procedure, the patient's blood pressure, pulse, and                            oxygen saturations were monitored continuously. The                            Olympus [id number] was introduced                            through the anus and advanced to the 3 cm into the                            ileum. The colonoscopy was performed without                            difficulty. The patient tolerated the procedure                            well. The quality of the bowel preparation was                            good. The terminal ileum, ileocecal valve,                            appendiceal orifice, and rectum were photographed. Scope In: 1:46:57 PM Scope Out: 2:07:22 PM Scope Withdrawal Time: 0 hours 15 minutes 19 seconds  Total Procedure Duration: 0 hours 20 minutes 25 seconds  Findings:                 Hemorrhoids were found on perianal exam.                           Non-bleeding internal hemorrhoids were found.                           A single small-mouthed diverticulum was  found in                            the sigmoid colon and descending colon.                           A 1 mm polyp was found in the splenic flexure. The                            polyp was sessile. I attempted to remove the polyp                            with a cold snare. But, it would repeatedly slide                            over the polyp. It was ultimately resected with                            cold forceps.  Resection and retrieval were                            complete. Estimated blood loss was minimal.                           A 2 mm polyp was found in the ascending colon. The                            polyp was sessile. The polyp was removed with a                            cold snare. Resection and retrieval were complete.                            Estimated blood loss was minimal.                           The exam was otherwise without abnormality on                            direct and retroflexion views. Complications:            No immediate complications. Estimated blood loss:                            Minimal. Estimated Blood Loss:     Estimated blood loss was minimal. Impression:               - Hemorrhoids found on perianal exam.                           - Non-bleeding internal hemorrhoids.                           - Diverticulosis in the sigmoid colon and in the                            descending colon.                           - One 1 mm polyp at the splenic flexure, removed                            with a cold snare. Resected and retrieved.                           - One 2 mm polyp in the ascending colon, removed                            with a cold snare. Resected and retrieved.                           - The examination was otherwise normal on direct  and retroflexion views. Recommendation:           - Patient has a contact number available for                            emergencies. The signs and symptoms of  potential                            delayed complications were discussed with the                            patient. Return to normal activities tomorrow.                            Written discharge instructions were provided to the                            patient.                           - Resume previous diet. High fiber diet encouraged.                           - Continue present medications.                           - Await pathology results.                           - Repeat colonoscopy date to be determined after                            pending pathology results are reviewed for                            surveillance.                           - Emerging evidence supports eating a diet of                            fruits, vegetables, grains, calcium, and yogurt                            while reducing red meat and alcohol may reduce the                            risk of colon cancer.                           - Thank you for allowing me to be involved in your                            colon cancer prevention. Thornton Park MD, MD 04/07/2020 2:21:39 PM This report has been signed electronically.

## 2020-04-07 NOTE — Progress Notes (Signed)
Called to room to assist during endoscopic procedure.  Patient ID and intended procedure confirmed with present staff. Received instructions for my participation in the procedure from the performing physician.  

## 2020-04-07 NOTE — Progress Notes (Signed)
History reviewed today  VS CW  

## 2020-04-07 NOTE — Progress Notes (Signed)
PT taken to PACU. Monitors in place. VSS. Report given to RN. 

## 2020-04-07 NOTE — Patient Instructions (Addendum)
Take your pantoprazole once daily on an empty stomach.  Try to decrease the acidic foods in your diet, and drink plenty of water. Read all of the handouts given to you by your recovery room nurse.  YOU HAD AN ENDOSCOPIC PROCEDURE TODAY AT [facility]:   Refer to the procedure report that was given to you for any specific questions about what was found during the examination.  If the procedure report does not answer your questions, please call your gastroenterologist to clarify.  If you requested that your care partner not be given the details of your procedure findings, then the procedure report has been included in a sealed envelope for you to review at your convenience later.  YOU SHOULD EXPECT: Some feelings of bloating in the abdomen. Passage of more gas than usual.  Walking can help get rid of the air that was put into your GI tract during the procedure and reduce the bloating. If you had a lower endoscopy (such as a colonoscopy or flexible sigmoidoscopy) you may notice spotting of blood in your stool or on the toilet paper. If you underwent a bowel prep for your procedure, you may not have a normal bowel movement for a few days.  Please Note:  You might notice some irritation and congestion in your nose or some drainage.  This is from the oxygen used during your procedure.  There is no need for concern and it should clear up in a day or so.  SYMPTOMS TO REPORT IMMEDIATELY:   Following lower endoscopy (colonoscopy or flexible sigmoidoscopy):  Excessive amounts of blood in the stool  Significant tenderness or worsening of abdominal pains  Swelling of the abdomen that is new, acute  Fever of 100F or higher   Following upper endoscopy (EGD)  Vomiting of blood or coffee ground material  New chest pain or pain under the shoulder blades  Painful or persistently difficult swallowing  New shortness of breath  Fever of 100F or higher  Black, tarry-looking stools  For urgent  or emergent issues, a gastroenterologist can be reached at any hour by calling [phone]. Do not use MyChart messaging for urgent concerns.    DIET:  We do recommend a small meal at first, but then you may proceed to your regular diet.  Drink plenty of fluids but you should avoid alcoholic beverages for 24 hours. Try to eat a high fiber diet, and drink plenty of water.  ACTIVITY:  You should plan to take it easy for the rest of today and you should NOT DRIVE or use heavy machinery until tomorrow (because of the sedation medicines used during the test).    FOLLOW UP: Our staff will call the number listed on your records 48-72 hours following your procedure to check on you and address any questions or concerns that you may have regarding the information given to you following your procedure. If we do not reach you, we will leave a message.  We will attempt to reach you two times.  During this call, we will ask if you have developed any symptoms of COVID 19. If you develop any symptoms (ie: fever, flu-like symptoms, shortness of breath, cough etc.) before then, please call [phone].  If you test positive for Covid 19 in the 2 weeks post procedure, please call and report this information to [place].    If any biopsies were taken you will be contacted by phone or by letter within the next 1-3 weeks.  Please  call us at [phone] if you have not heard about the biopsies in 3 weeks.    SIGNATURES/CONFIDENTIALITY: You and/or your care partner have signed paperwork which will be entered into your electronic medical record.  These signatures attest to the fact that that the information above on your After Visit Summary has been reviewed and is understood.  Full responsibility of the confidentiality of this discharge information lies with you and/or your care-partner.

## 2020-04-07 NOTE — Op Note (Signed)
[facility] Patient Name: Alexander Griffith Procedure Date: 04/07/2020 1:11 PM MRN: [id number] Endoscopist: Thornton Park MD, MD Age: 70 Referring MD:  Date of Birth: [birth year] Gender: Male Account #: [id number] Procedure:                Upper GI endoscopy Indications:              Dysphagia Medicines:                Monitored Anesthesia Care Procedure:                Pre-Anesthesia Assessment:                           - Prior to the procedure, a History and Physical                            was performed, and patient medications and                            allergies were reviewed. The patient's tolerance of                            previous anesthesia was also reviewed. The risks                            and benefits of the procedure and the sedation                            options and risks were discussed with the patient.                            All questions were answered, and informed consent                            was obtained. Prior Anticoagulants: The patient has                            taken no previous anticoagulant or antiplatelet                            agents. ASA Grade Assessment: II - A patient with                            mild systemic disease. After reviewing the risks                            and benefits, the patient was deemed in                            satisfactory condition to undergo the procedure.                           After obtaining informed consent, the endoscope was  passed under direct vision. Throughout the                            procedure, the patient's blood pressure, pulse, and                            oxygen saturations were monitored continuously. The                            Endoscope was introduced through the mouth, and                            advanced to the third part of duodenum. The upper                            GI endoscopy was accomplished without  difficulty.                            The patient tolerated the procedure well. Scope In: Scope Out: Findings:                 LA Grade A (one or more mucosal breaks less than 5                            mm, not extending between tops of 2 mucosal folds)                            esophagitis with no bleeding was found in the                            distal esophagus. Biopsies were obtained from the                            proximal and distal esophagus with cold forceps for                            histology.                           Patchy mild inflammation characterized by erythema,                            friability and granularity was found in the gastric                            body. Biopsies were taken from the antrum, body,                            and fundus with a cold forceps for histology.                            Estimated blood loss was minimal.  There was diffuse inflammation in the duodenal bulb                            consistent with duodenitis. There were scattered                            erosion and one non-bleeding cratered duodenal                            ulcer with no stigmata of bleeding was found in the                            duodenal bulb. The lesion was 2 mm in largest                            dimension. Biopsies were taken with a cold forceps                            for histology. Estimated blood loss was minimal.                           The cardia and gastric fundus were normal on                            retroflexion.                           The exam was otherwise without abnormality. Complications:            No immediate complications. Estimated blood loss:                            Minimal. Estimated Blood Loss:     Estimated blood loss: none. Impression:               - LA Grade A reflux esophagitis with no bleeding.                            Biopsied.                           -  Gastritis. Biopsied.                           - Duodenitis, duodenal erosions and a non-bleeding                            duodenal ulcer with no stigmata of bleeding.                            Biopsied.                           - The examination was otherwise normal. Recommendation:           - Patient has a contact number available for  emergencies. The signs and symptoms of potential                            delayed complications were discussed with the                            patient. Return to normal activities tomorrow.                            Written discharge instructions were provided to the                            patient.                           - Resume previous diet.                           - Continue present medications.                           - Start pantoprazole 40 mg QAM x 12 weeks.                           - No aspirin, ibuprofen, naproxen, or other                            non-steroidal anti-inflammatory drugs.                           - Await pathology results. Thornton Park MD, MD 04/07/2020 2:17:05 PM This report has been signed electronically.

## 2020-04-11 ENCOUNTER — Telehealth: Payer: Self-pay | Admitting: *Deleted

## 2020-04-11 NOTE — Telephone Encounter (Signed)
  Follow up Call-  Call back number 04/07/2020  Post procedure Call Back phone  # [phone]  Permission to leave phone message Yes  Some recent data might be hidden     Patient questions:  Do you have a fever, pain , or abdominal swelling? No. Pain Score  0 *  Have you tolerated food without any problems? Yes.    Have you been able to return to your normal activities? Yes.    Do you have any questions about your discharge instructions: Diet   No. Medications  No. Follow up visit  No.  Do you have questions or concerns about your Care? No.  Actions: * If pain score is 4 or above: No action needed, pain <4.  1. Have you developed a fever since your procedure? no  2.   Have you had an respiratory symptoms (SOB or cough) since your procedure? no  3.   Have you tested positive for COVID 19 since your procedure no  4.   Have you had any family members/close contacts diagnosed with the COVID 19 since your procedure?  no   If yes to any of these questions please route to Joylene John, RN and Joella Prince, RN

## 2020-04-25 ENCOUNTER — Encounter: Payer: Self-pay | Admitting: Gastroenterology

## 2020-06-03 ENCOUNTER — Ambulatory Visit (INDEPENDENT_AMBULATORY_CARE_PROVIDER_SITE_OTHER): Payer: Medicare Other | Admitting: Otolaryngology

## 2020-07-01 ENCOUNTER — Encounter: Payer: Self-pay | Admitting: Family Medicine

## 2020-07-02 ENCOUNTER — Telehealth (INDEPENDENT_AMBULATORY_CARE_PROVIDER_SITE_OTHER): Payer: Medicare Other | Admitting: Family Medicine

## 2020-07-02 ENCOUNTER — Encounter: Payer: Self-pay | Admitting: Family Medicine

## 2020-07-02 DIAGNOSIS — U071 COVID-19: Secondary | ICD-10-CM | POA: Diagnosis not present

## 2020-07-02 MED ORDER — NIRMATRELVIR/RITONAVIR (PAXLOVID)TABLET: 3.0000 | ORAL_TABLET | Freq: Two times a day (BID) | ORAL | 0 refills | Status: AC

## 2020-07-02 NOTE — Addendum Note (Signed)
Addended by: Ria Bush on: 07/02/2020 10:40 AM   Modules accepted: Level of Service

## 2020-07-02 NOTE — Progress Notes (Addendum)
Patient ID: Alexander Griffith, male    DOB: [birth year], 70 y.o.   MRN: [id number]  Virtual visit completed through [place], a video enabled telemedicine application. Due to national recommendations of social distancing due to COVID-19, a virtual visit is felt to be most appropriate for this patient at this time. Reviewed limitations, risks, security and privacy concerns of performing a virtual visit and the availability of in person appointments. I also reviewed that there may be a patient responsible charge related to this service. The patient agreed to proceed.   Patient location: home Provider location: [place] at [facility], office Persons participating in this virtual visit: patient, provider   If any vitals were documented, they were collected by patient at home unless specified below.    BP 126/67   Temp 98.4 F (36.9 C)   Ht 5\' 8"  (1.727 m)   Wt 192 lb (87.1 kg)   SpO2 95% Comment: ra  BMI 29.19 kg/m   BP Readings from Last 3 Encounters:  07/02/20 126/67  04/07/20 (!) 143/90  02/10/20 124/74    CC: COVID positive Subjective:   HPI: Alexander Griffith is a 70 y.o. male presenting on 07/02/2020 for Covid Positive (Tested results yesterday. Headache and congestion. Has had first 2 vaccinations. )   Known h/o HTN, hypothyroidism, hyperglycemia. Lab Results  Component Value Date   HGBA1C 5.2 07/22/2014    First day of symptoms: 4/26 pm He tested positive 07/01/2020 at home.  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  Wife tested positive for COVID prior to patient.   Treating with tylenol and advil.   Current symptoms include headache, head congestion and sinus pressure. Initial fatigue, Tmax 100.6, mild cough persists.   No ear pain, tooth pain, abd pain, diarrhea, nausea, dyspnea or wheezing. No chest pain.  Non smoker.  No h/o asthma.   No h/o kidney disease. He feels he's staying well hydrated, good UOP.  Lab Results  Component Value Date   CREATININE 0.98  12/04/2019   GFR 79      Relevant past medical, surgical, family and social history reviewed and updated as indicated. Interim medical history since our last visit reviewed. Allergies and medications reviewed and updated. Outpatient Medications Prior to Visit  Medication Sig Dispense Refill  . acyclovir (ZOVIRAX) 400 MG tablet Take 1 tablet (400 mg total) by mouth 2 (two) times daily as needed (cold sores). 180 tablet 3  . aspirin 81 MG tablet Take 81 mg by mouth daily.    . Biotin 300 MCG TABS Take by mouth 2 (two) times daily.    [name] co-enzyme Q-10 30 MG capsule Take 30 mg by mouth daily.    . diphenhydrAMINE (BENADRYL) 25 mg capsule     . fish oil-omega-3 fatty acids 1000 MG capsule Take 1 g by mouth daily.    [name] glucosamine-chondroitin 500-400 MG tablet Take 2 tablets by mouth daily.    [name] levothyroxine (SYNTHROID) 100 MCG tablet Take 1 tablet (100 mcg total) by mouth daily. 100 tablet 4  . losartan (COZAAR) 50 MG tablet Take 1 tablet (50 mg total) by mouth daily. 100 tablet 4  . pantoprazole (PROTONIX) 40 MG tablet Use daily for 12 weeks per Dr. Tarri Glenn. 90 tablet 0  . simvastatin (ZOCOR) 20 MG tablet Take 1 tablet (20 mg total) by mouth every evening. 100 tablet 4   No facility-administered medications prior to visit.     Per HPI unless specifically indicated in ROS section below  Review of Systems Objective:  BP 126/67   Temp 98.4 F (36.9 C)   Ht 5\' 8"  (1.727 m)   Wt 192 lb (87.1 kg)   SpO2 95% Comment: ra  BMI 29.19 kg/m   Wt Readings from Last 3 Encounters:  07/02/20 192 lb (87.1 kg)  04/07/20 192 lb (87.1 kg)  02/10/20 192 lb 4 oz (87.2 kg)       Physical exam: Gen: alert, NAD, not ill appearing Pulm: speaks in complete sentences without increased work of breathing Psych: normal mood, normal thought content      Assessment & Plan:   Problem List Items Addressed This Visit    COVID-19 virus infection    First day of symptoms 06/28/2020.  He is vaccinated  x2. Reviewed anticipated course of recovery as well as red flags to seek in person care. He does have pulse ox at home.  Reviewed supportive care - rec tylenol, fluids, rest, vit C, D, zinc.  Discussed treatment options of paxlovid vs mulnopiravir. Given no h/o CKD, good hydration status and latest GFR 79, I will Rx full dose paxlovid, reviewing common side effects to monitor for. I asked him to HOLD simvastatin for 5 days while on paxlovid due to significant drug interactions, may restart 3 days after completes paxlovid therapy.  Reviewed latest CDC isolation and masking guidelines.  Asked him to keep [place] updated with course of illness.       Relevant Medications   nirmatrelvir/ritonavir EUA (PAXLOVID) TABS       Meds ordered this encounter  Medications  . nirmatrelvir/ritonavir EUA (PAXLOVID) TABS    Sig: Take 3 tablets by mouth 2 (two) times daily for 5 days. Patient GFR is 79. Take nirmatrelvir (150 mg) 2 tablet(s) twice daily for 5 days and ritonavir (100 mg) one tablet twice daily for 5 days    Dispense:  30 tablet    Refill:  0   No orders of the defined types were placed in this encounter.   I discussed the assessment and treatment plan with the patient. The patient was provided an opportunity to ask questions and all were answered. The patient agreed with the plan and demonstrated an understanding of the instructions. The patient was advised to call back or seek an in-person evaluation if the symptoms worsen or if the condition fails to improve as anticipated.  Follow up plan: No follow-ups on file.  Ria Bush, MD

## 2020-07-02 NOTE — Assessment & Plan Note (Addendum)
First day of symptoms 06/28/2020.  He is vaccinated x2. Reviewed anticipated course of recovery as well as red flags to seek in person care. He does have pulse ox at home.  Reviewed supportive care - rec tylenol, fluids, rest, vit C, D, zinc.  Discussed treatment options of paxlovid vs mulnopiravir. Given no h/o CKD, good hydration status and latest GFR 79, I will Rx full dose paxlovid, reviewing common side effects to monitor for. I asked him to HOLD simvastatin for 5 days while on paxlovid due to significant drug interactions, may restart 3 days after completes paxlovid therapy.  Reviewed latest CDC isolation and masking guidelines.  Asked him to keep [place] updated with course of illness.

## 2020-10-25 ENCOUNTER — Encounter: Payer: Self-pay | Admitting: Family Medicine

## 2020-10-25 NOTE — Telephone Encounter (Signed)
Pt called to see if a nurse or Dr Yong Channel has had time to review his MyChart message. Pt believes he may have a staff infection. Please Advise

## 2020-10-26 ENCOUNTER — Encounter: Payer: Self-pay | Admitting: Physician Assistant

## 2020-10-26 ENCOUNTER — Ambulatory Visit (INDEPENDENT_AMBULATORY_CARE_PROVIDER_SITE_OTHER): Payer: Medicare Other | Admitting: Physician Assistant

## 2020-10-26 VITALS — BP 148/80 | HR 56 | Temp 98.7°F | Ht 68.0 in | Wt 192.0 lb

## 2020-10-26 DIAGNOSIS — H6982 Other specified disorders of Eustachian tube, left ear: Secondary | ICD-10-CM | POA: Diagnosis not present

## 2020-10-26 DIAGNOSIS — K1329 Other disturbances of oral epithelium, including tongue: Secondary | ICD-10-CM

## 2020-10-26 LAB — POCT RAPID STREP A (OFFICE): Rapid Strep A Screen: NEGATIVE

## 2020-10-26 MED ORDER — AZELASTINE HCL 0.1 % NA SOLN: 2.0000 | Freq: Two times a day (BID) | NASAL | 12 refills | Status: DC

## 2020-10-26 NOTE — Progress Notes (Signed)
Acute Office Visit  Subjective:    Patient ID: Alexander Griffith, male    DOB: [birth year], 70 y.o.   MRN: [id number]  Chief Complaint  Patient presents with   Spots    Pt says that he noticed yesterday. Under tongue, and throat. He denies fever.    Ear Fullness    Cloudy feeling; dizziness. Started back 3-4 weeks ago. White spots after cleaning.     Ear Fullness   Patient is in today for left ear fullness for the last 3 to 4 weeks or longer.  He also states that he has had a white spot show up on the back of his throat.  He denies any fever or chills.  No headaches.  No drainage.  No sick contacts.  He has been taking some Sudafed and Mucinex over-the-counter.  Past Medical History:  Diagnosis Date   Bilateral cataracts 09/19/2017   Colon polyps    1990s   Diverticulosis of sigmoid colon    noted on 06/2010 colonoscopy.   History of skin cancer    skin   HSV infection    acyclovir for cold sores   Hyperlipidemia    Hypertension    Hypothyroidism     Past Surgical History:  Procedure Laterality Date   ACHILLES TENDON REPAIR     ANTERIOR CRUCIATE LIGAMENT REPAIR     right, 09   BASAL CELL CARCINOMA EXCISION     05/2010   CATARACT EXTRACTION Bilateral 2019   COLONOSCOPY     Dupuytren's Contracture Bilateral    INGUINAL HERNIA REPAIR Left 07/01/2019   Procedure: LEFT INGUINAL HERNIA WITH MESH;  Surgeon: Erroll Luna, MD;  Location: [facility];  Service: General;  Laterality: Left;  GENERAL AND TAP BLOCK   POLYPECTOMY     in 1990s, adenomatous colon polyp   SHOULDER ARTHROSCOPY Left 02/09/2019    Family History  Problem Relation Age of Onset   Colon cancer Paternal Grandfather    Hypertension Mother        died at 78   Stroke Maternal Grandfather    Heart disease Father        heart attack 37, smoking   Lung cancer Father        2 years after heart attack   Other Daughter        accidental death- high level delsym ingredient   Esophageal cancer  Neg Hx    Rectal cancer Neg Hx    Stomach cancer Neg Hx     Social History   Socioeconomic History   Marital status: Married    Spouse name: Not on file   Number of children: 2   Years of education: Not on file   Highest education level: Not on file  Occupational History   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 social alcohol   Drug use: No   Sexual activity: Yes    Partners: Female  Other Topics Concern   Not on file  Social History Narrative   Married since 1974. Lost oldest daughter in 2008. 2 living children- 2 grandkids both in town- they keep the youngest 70 years old in 2019.       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 Held back some- because had to help wife after rotator cuff surgery.    Social Determinants of Health   Financial Resource Strain: Low Risk    Difficulty of Paying Living Expenses: Not hard at all  Food Insecurity: No Food Insecurity   Worried About Charity fundraiser in the Last Year: Never true   [place] in the Last Year: Never true  Transportation Needs: No Transportation Needs   Lack of Transportation (Medical): No   Lack of Transportation (Non-Medical): No  Physical Activity: Sufficiently Active   Days of Exercise per Week: 5 days   Minutes of Exercise per Session: 120 min  Stress: Stress Concern Present   Feeling of Stress : To some extent  Social Connections: Moderately Integrated   Frequency of Communication with Friends and Family: More than three times a week   Frequency of Social Gatherings with Friends and Family: Twice a week   Attends Religious Services: More than 4 times per year   Active Member  of [employer] or Organizations: No   Attends Archivist Meetings: Never   Marital Status: Married  Human resources officer Violence: Not At Risk   Fear of Current or Ex-Partner: No   Emotionally Abused: No   Physically Abused: No   Sexually Abused: No    Outpatient Medications Prior to Visit  Medication Sig Dispense Refill   aspirin 81 MG tablet Take 81 mg by mouth daily.     Biotin 300 MCG TABS Take by mouth 2 (two) times daily.     co-enzyme Q-10 30 MG capsule Take 30 mg by mouth daily.     diphenhydrAMINE (BENADRYL) 25 mg capsule      fish oil-omega-3 fatty acids 1000 MG capsule Take 1 g by mouth daily.     glucosamine-chondroitin 500-400 MG tablet Take 2 tablets by mouth daily.     levothyroxine (SYNTHROID) 100 MCG tablet Take 1 tablet (100 mcg total) by mouth daily. 100 tablet 4   losartan (COZAAR) 50 MG tablet Take 1 tablet (50 mg total) by mouth daily. 100 tablet 4   pantoprazole (PROTONIX) 40 MG tablet Use daily for 12 weeks per Dr. Tarri Glenn. 90 tablet 0   simvastatin (ZOCOR) 20 MG tablet Take 1 tablet (20 mg total) by mouth every evening. 100 tablet 4   acyclovir (ZOVIRAX) 400 MG tablet Take 1 tablet (400 mg total) by mouth 2 (two) times daily as needed (cold sores). (Patient not taking: Reported on 10/26/2020) 180 tablet 3   No facility-administered medications prior to visit.    No Known Allergies  Review of Systems REFER TO HPI FOR PERTINENT POSITIVES AND NEGATIVES     Objective:    Physical Exam Vitals and nursing note reviewed.  Constitutional:      Appearance: Normal appearance. He is normal weight.  HENT:     Right Ear: Ear canal and external ear normal. No drainage, swelling or tenderness. No middle ear effusion. There is no impacted cerumen. Tympanic membrane is not erythematous.     Left Ear: Ear canal and external ear normal. No drainage, swelling or tenderness. A middle ear effusion is present. There is no impacted cerumen. Tympanic membrane is not  erythematous.     Mouth/Throat:     Comments: One white exudate R tonsil Neurological:     Mental Status: He is alert.  BP (!) 148/80   Pulse (!) 56   Temp 98.7 F (37.1 C) (Temporal)   Ht '5\' 8"'$  (1.727 m)   Wt 192 lb 0.3 oz (87.1 kg)   SpO2 97%   BMI 29.20 kg/m  Wt Readings from Last 3 Encounters:  10/26/20 192 lb 0.3 oz (87.1 kg)  07/02/20 192 lb (87.1 kg)  04/07/20 192 lb (87.1 kg)    Health Maintenance Due  Topic Date Due   COVID-19 Vaccine (3 - Booster for Pfizer series) 10/05/2019   INFLUENZA VACCINE  10/03/2020    There are no preventive care reminders to display for this patient.   Lab Results  Component Value Date   TSH 4.32 12/04/2019   Lab Results  Component Value Date   WBC 5.8 12/04/2019   HGB 13.1 (L) 12/04/2019   HCT 39.5 12/04/2019   MCV 96.3 12/04/2019   PLT 230 12/04/2019   Lab Results  Component Value Date   NA 139 12/04/2019   K 3.9 12/04/2019   CO2 28 12/04/2019   GLUCOSE 103 (H) 12/04/2019   BUN 16 12/04/2019   CREATININE 0.98 12/04/2019   BILITOT 0.4 12/04/2019   ALKPHOS 57 06/24/2019   AST 16 12/04/2019   ALT 12 12/04/2019   PROT 6.3 12/04/2019   ALBUMIN 3.7 06/24/2019   CALCIUM 8.7 12/04/2019   ANIONGAP 9 06/24/2019   GFR 78.87 11/26/2018   Lab Results  Component Value Date   CHOL 169 12/04/2019   Lab Results  Component Value Date   HDL 63 12/04/2019   Lab Results  Component Value Date   LDLCALC 87 12/04/2019   Lab Results  Component Value Date   TRIG 96 12/04/2019   Lab Results  Component Value Date   CHOLHDL 2.7 12/04/2019   Lab Results  Component Value Date   HGBA1C 5.2 07/22/2014       Assessment & Plan:   Problem List Items Addressed This Visit   None Visit Diagnoses     White patches on oral mucosa    -  Primary   Relevant Orders   POCT rapid strep A (Completed)   Chronic dysfunction of left eustachian tube            Meds ordered this encounter  Medications   azelastine (ASTELIN)  0.1 % nasal spray    Sig: Place 2 sprays into both nostrils 2 (two) times daily. Use in each nostril as directed    Dispense:  30 mL    Refill:  12   1. White patches on oral mucosa Reassured the patient today that his rapid strep test is negative.  With no other symptoms such as sore throat or fever, doubt actual strep infection and therefore did not complete the strep culture today.  Reassured him this was most likely a tonsil stone and that he should use salt water gargles and Listerine.  2. Chronic dysfunction of left eustachian tube Reassured patient I do not see any signs of infection.  Most likely he has a chronic eustachian tube dysfunction and he would like to see ENT.  He is going to call Dr. Deeann Saint office to schedule.  Encouraged him to drink plenty of fluids and to try Astelin spray as well as Flonase and daily allergy medication.   Mickle Campton M Tyreona Panjwani, PA-C

## 2020-10-26 NOTE — Patient Instructions (Addendum)
Good to see you today. Rapid strep test negative; will send out culture. However, this looks mostly like tonsil stones. Recommend salt water gargles and Listerine. I do recommend seeing ENT Dr. Benjamine Mola for left ear ongoing issues - likely eustachian tube dysfunction. Continue Flonase and daily allergy medication. Add Astelin nasal spray, which I have sent to your pharmacy.  Antibiotics not indicated at this time.

## 2020-10-31 ENCOUNTER — Other Ambulatory Visit: Payer: Self-pay

## 2020-10-31 MED ORDER — AZELASTINE HCL 0.1 % NA SOLN: 2.0000 | Freq: Two times a day (BID) | NASAL | 12 refills | Status: DC

## 2020-11-08 ENCOUNTER — Other Ambulatory Visit: Payer: Self-pay

## 2020-11-08 ENCOUNTER — Encounter: Payer: Self-pay | Admitting: Family Medicine

## 2020-11-08 DIAGNOSIS — E785 Hyperlipidemia, unspecified: Secondary | ICD-10-CM

## 2020-11-08 DIAGNOSIS — E039 Hypothyroidism, unspecified: Secondary | ICD-10-CM

## 2020-11-08 DIAGNOSIS — Z0000 Encounter for general adult medical examination without abnormal findings: Secondary | ICD-10-CM

## 2020-11-08 DIAGNOSIS — Z125 Encounter for screening for malignant neoplasm of prostate: Secondary | ICD-10-CM

## 2020-12-02 ENCOUNTER — Other Ambulatory Visit (HOSPITAL_COMMUNITY): Payer: Self-pay | Admitting: Orthopedic Surgery

## 2020-12-05 ENCOUNTER — Other Ambulatory Visit (INDEPENDENT_AMBULATORY_CARE_PROVIDER_SITE_OTHER): Payer: Medicare Other

## 2020-12-05 ENCOUNTER — Other Ambulatory Visit: Payer: Self-pay

## 2020-12-05 DIAGNOSIS — Z0000 Encounter for general adult medical examination without abnormal findings: Secondary | ICD-10-CM | POA: Diagnosis not present

## 2020-12-05 DIAGNOSIS — E785 Hyperlipidemia, unspecified: Secondary | ICD-10-CM

## 2020-12-05 DIAGNOSIS — E039 Hypothyroidism, unspecified: Secondary | ICD-10-CM

## 2020-12-05 DIAGNOSIS — Z125 Encounter for screening for malignant neoplasm of prostate: Secondary | ICD-10-CM

## 2020-12-05 LAB — COMPREHENSIVE METABOLIC PANEL
ALT: 12 U/L (ref 0–53)
AST: 16 U/L (ref 0–37)
Albumin: 4 g/dL (ref 3.5–5.2)
Alkaline Phosphatase: 50 U/L (ref 39–117)
BUN: 17 mg/dL (ref 6–23)
CO2: 27 mEq/L (ref 19–32)
Calcium: 8.7 mg/dL (ref 8.4–10.5)
Chloride: 105 mEq/L (ref 96–112)
Creatinine, Ser: 1.01 mg/dL (ref 0.40–1.50)
GFR: 75.66 mL/min (ref >60.00)
Glucose, Bld: 105 mg/dL — ABNORMAL HIGH (ref 70–99)
Potassium: 3.9 mEq/L (ref 3.5–5.1)
Sodium: 139 mEq/L (ref 135–145)
Total Bilirubin: 0.4 mg/dL (ref 0.2–1.2)
Total Protein: 6.1 g/dL (ref 6.0–8.3)

## 2020-12-05 LAB — LIPID PANEL
Cholesterol: 150 mg/dL (ref 0–200)
HDL: 60.5 mg/dL (ref >39.00)
LDL Cholesterol: 70 mg/dL (ref 0–99)
NonHDL: 89.1
Total CHOL/HDL Ratio: 2
Triglycerides: 94 mg/dL (ref 0.0–149.0)
VLDL: 18.8 mg/dL (ref 0.0–40.0)

## 2020-12-05 LAB — CBC WITH DIFFERENTIAL/PLATELET
Basophils Absolute: 0 10*3/uL (ref 0.0–0.1)
Basophils Relative: 0.4 % (ref 0.0–3.0)
Eosinophils Absolute: 0.1 10*3/uL (ref 0.0–0.7)
Eosinophils Relative: 1.7 % (ref 0.0–5.0)
HCT: 39.8 % (ref 39.0–52.0)
Hemoglobin: 13.2 g/dL (ref 13.0–17.0)
Lymphocytes Relative: 41.9 % (ref 12.0–46.0)
Lymphs Abs: 2.4 10*3/uL (ref 0.7–4.0)
MCHC: 33.2 g/dL (ref 30.0–36.0)
MCV: 92.2 fl (ref 78.0–100.0)
Monocytes Absolute: 0.4 10*3/uL (ref 0.1–1.0)
Monocytes Relative: 7.5 % (ref 3.0–12.0)
Neutro Abs: 2.8 10*3/uL (ref 1.4–7.7)
Neutrophils Relative %: 48.5 % (ref 43.0–77.0)
Platelets: 242 10*3/uL (ref 150.0–400.0)
RBC: 4.32 Mil/uL (ref 4.22–5.81)
RDW: 14 % (ref 11.5–15.5)
WBC: 5.7 10*3/uL (ref 4.0–10.5)

## 2020-12-05 LAB — PSA: PSA: 1.03 ng/mL (ref 0.10–4.00)

## 2020-12-05 LAB — TSH: TSH: 3.71 u[IU]/mL (ref 0.35–5.50)

## 2020-12-06 NOTE — Patient Instructions (Addendum)
Health Maintenance Due  Topic Date Due   COVID-19 Vaccine (3 - Booster for [employer] series) - Recommended getting Omicron specific booster only at your local pharmacy -  I do think this would be reasonable to get. Please let us know when you have received this vaccination.  10/05/2019   INFLUENZA VACCINE  - High Dose flu shot today before you leave.  10/03/2020   In regards to your recent prolonged vertigo episodes, I do think it is best to keep your upcoming ENT appointment. I suspect Mnire's disease but I would like to get their expert opinion after your evaluation.  I recommend that you restrict on salt intake. I will attach a low sodium handout for you to study from and to help you make beneficial dietary choices.  No changes in medications today.  If you ever notice any blood in your stools, we can consider having you get a colonoscopy if needed.  Please monitor home blood pressure readings and update me with through MyChart by next week. The goal at home is to be <135/85 before we make any changes in medications.  Recommended follow up: Return for Consider 6 month follow-up but please definitely schedule 1 year physical .

## 2020-12-06 NOTE — Progress Notes (Signed)
Phone: [phone]   Subjective:  Patient presents today for their annual physical. Chief complaint-noted.   See problem oriented charting- Review of Systems  Constitutional:  Negative for chills and fever.  HENT:  Positive for congestion, hearing loss (during vertigo episodes) and tinnitus (stable baseline- not worse with vertigo episodes). Negative for ear discharge and ear pain.   Eyes:  Negative for blurred vision and double vision.  Respiratory:  Negative for cough, shortness of breath and wheezing.   Cardiovascular:  Negative for chest pain and palpitations.  Gastrointestinal:  Negative for abdominal pain, blood in stool, constipation, diarrhea, heartburn, melena, nausea and vomiting.  Genitourinary:  Negative for dysuria and frequency.  Musculoskeletal:  Negative for back pain, myalgias and neck pain.  Skin:  Negative for itching and rash.  Neurological:  Positive for dizziness. Negative for headaches (vertigo but not recently).  Endo/Heme/Allergies:  Negative for polydipsia. Does not bruise/bleed easily.  Psychiatric/Behavioral:  Negative for depression and suicidal ideas.    The following were reviewed and entered/updated in epic: Past Medical History:  Diagnosis Date   Bilateral cataracts 09/19/2017   Colon polyps    1990s   Diverticulosis of sigmoid colon    noted on 06/2010 colonoscopy.   History of skin cancer    skin   HSV infection    acyclovir for cold sores   Hyperlipidemia    Hypertension    Hypothyroidism    Patient Active Problem List   Diagnosis Date Noted   Ocular migraine 06/01/2019    Priority: 2.   Hyperglycemia 12/04/2018    Priority: 2.   HSV infection     Priority: 2.   Hyperlipemia 04/04/2007    Priority: 2.   Hypothyroidism 12/06/2006    Priority: 2.   Essential hypertension 12/06/2006    Priority: 2.   SKIN CANCER, HX OF 12/06/2006    Priority: 2.   Dupuytren contracture 01/22/2018    Priority: 3.   HIP PAIN, BILATERAL 04/04/2007     Priority: 3.   History of colonic polyps 12/06/2006    Priority: 3.   COVID-19 virus infection 07/02/2020   Past Surgical History:  Procedure Laterality Date   ACHILLES TENDON REPAIR     ANTERIOR CRUCIATE LIGAMENT REPAIR     right, 09   BASAL CELL CARCINOMA EXCISION     05/2010   CATARACT EXTRACTION Bilateral 2019   COLONOSCOPY     Dupuytren's Contracture Bilateral    INGUINAL HERNIA REPAIR Left 07/01/2019   Procedure: LEFT INGUINAL HERNIA WITH MESH;  Surgeon: Erroll Luna, MD;  Location: [facility];  Service: General;  Laterality: Left;  GENERAL AND TAP BLOCK   POLYPECTOMY     in 1990s, adenomatous colon polyp   SHOULDER ARTHROSCOPY Left 02/09/2019    Family History  Problem Relation Age of Onset   Colon cancer Paternal Grandfather    Hypertension Mother        died at 77   Stroke Maternal Grandfather    Heart disease Father        heart attack 62, smoking   Lung cancer Father        2 years after heart attack   Other Daughter        accidental death- high level delsym ingredient   Esophageal cancer Neg Hx    Rectal cancer Neg Hx    Stomach cancer Neg Hx     Medications- reviewed and updated Current Outpatient Medications  Medication Sig Dispense Refill  aspirin 81 MG tablet Take 81 mg by mouth daily.     Biotin 300 MCG TABS Take by mouth 2 (two) times daily.     co-enzyme Q-10 30 MG capsule Take 30 mg by mouth daily.     diphenhydrAMINE (BENADRYL) 25 mg capsule      fish oil-omega-3 fatty acids 1000 MG capsule Take 1 g by mouth daily.     glucosamine-chondroitin 500-400 MG tablet Take 2 tablets by mouth daily.     acyclovir (ZOVIRAX) 400 MG tablet Take 1 tablet (400 mg total) by mouth 2 (two) times daily as needed (cold sores). 180 tablet 3   azelastine (ASTELIN) 0.1 % nasal spray Place 2 sprays into both nostrils 2 (two) times daily. Use in each nostril as directed 90 mL 3   levothyroxine (SYNTHROID) 100 MCG tablet Take 1 tablet (100 mcg  total) by mouth daily. 100 tablet 4   losartan (COZAAR) 50 MG tablet Take 1 tablet (50 mg total) by mouth daily. 100 tablet 4   simvastatin (ZOCOR) 20 MG tablet Take 1 tablet (20 mg total) by mouth every evening. 100 tablet 4   No current facility-administered medications for this visit.    Allergies-reviewed and updated No Known Allergies  Social History   Social History Narrative   Married since 1974. Lost oldest daughter in 2008. 2 living children- 2 grandkids both in town- they keep the youngest 70 years old in 2019.       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 Held back some- because had to help wife after rotator cuff surgery.    Objective  Objective:  BP (!) 145/81   Pulse 70   Temp (!) 97.1 F (36.2 C) (Temporal)   Wt 189 lb 12.8 oz (86.1 kg)   SpO2 99%   BMI 28.86 kg/m  Gen: NAD, resting comfortably HEENT: Mucous membranes are moist. Oropharynx normal Neck: no thyromegaly CV: RRR no murmurs rubs or gallops Lungs: CTAB no crackles, wheeze, rhonchi Abdomen: soft/nontender/nondistended/normal bowel sounds. No rebound or guarding.  Ext: no edema Skin: warm, dry Neuro: CN II-XII intact, sensation and reflexes normal throughout, 5/5 muscle strength in bilateral upper and lower extremities. Normal finger to nose. Normal rapid alternating movements. No pronator drift. Normal romberg. Normal gait.     Assessment and Plan  70 y.o. male presenting for annual physical.  Health Maintenance counseling: 1. Anticipatory guidance: Patient counseled regarding regular dental exams -q6 months- doing invasalign, eye exams -yearly- now seeing Dr. Shelba Flake for tear- doing yearly then in between 6 months with regular doctor,  avoiding smoking and second hand smoke , limiting alcohol to 2 beverages per day . No illicit drug use  2. Risk factor reduction:  Advised patient of need  for regular exercise and diet rich and fruits and vegetables to reduce risk of heart attack and stroke. Exercise- rides his bike regularly (some ankle pain depending on the ride/turn) - congratulated effort. Doing some elliptical and swimming  Diet-reasonably healthy diet Wt Readings from Last 3 Encounters:  12/08/20 189 lb 12.8 oz (86.1 kg)  10/26/20 192 lb 0.3 oz (87.1 kg)  07/02/20 192 lb (87.1 kg)  3. Immunizations/screenings/ancillary studies- disussed Omicron/bivalent booster-at pharmacy- and Flu shot-high dose today - otherwise up-to-date. Immunization History  Administered Date(s) Administered   Fluad Quad(high Dose 65+) 11/26/2018, 12/08/2019   Influenza Whole 12/03/2008   Influenza,  High Dose Seasonal PF 11/26/2016, 12/02/2017   Influenza,inj,Quad PF,6+ Mos 11/22/2015   Influenza,inj,quad, With Preservative 12/18/2016   PFIZER(Purple Top)SARS-COV-2 Vaccination 04/09/2019, 05/05/2019   Pneumococcal Conjugate-13 11/26/2016   Pneumococcal Polysaccharide-23 12/02/2017   Td 04/06/2008   Tdap 09/01/2018   Zoster Recombinat (Shingrix) 07/16/2017, 09/16/2017   Zoster, Live 05/31/2011  4. Prostate cancer screening- low risk based off prior PSA trend  Lab Results  Component Value Date   PSA 1.03 12/05/2020   PSA 0.83 12/04/2019   PSA 1.17 11/26/2018   5. Colon cancer screening - last done 04/07/2020 with a 7-year repeat planned 6. Skin cancer screening- has seen dermatology in the past but not recently- has had skin cancer in past. advised regular sunscreen use. Denies worrisome, changing, or new skin lesions.  7. Smoking associated screening (lung cancer screening, AAA screen 65-75, UA)- Never smoker 8. STD screening - opts out as mongamous  Status of chronic or acute concerns   # Patient is pre-admitted to have Left Ankle Peroneus Brevis and Longus Tendon Reconstruction on 12/29/2020 with Dr. Doran Durand - able to complete 4 mets of activity without chest pain or SOB- would not need  further cardiac clearance  #hypertension S: medication: Losartan 50 mg daily  Home readings #s: typically 120s BP Readings from Last 3 Encounters:  12/08/20 (!) 145/81  10/26/20 (!) 148/80  07/02/20 126/67  A/P: Likely has a whitecoat hypertension element.  Blood pressure has been well controlled at home-I asked him to monitor a few times over the next week and send me an update via MyChart.  For now we will continue current medication  #hyperlipidemia S: Medication:Simvastatin 20 mg, aspirin for primary prevention- he prefers to take this Lab Results  Component Value Date   CHOL 150 12/05/2020   HDL 60.50 12/05/2020   LDLCALC 70 12/05/2020   TRIG 94.0 12/05/2020   CHOLHDL 2 12/05/2020  A/P: Excellent control with LDL at ideal goal of 70 or less-continue current medication  #hypothyroidism S: compliant On thyroid medication-levothyroxine 100 mcg Lab Results  Component Value Date   TSH 3.71 12/05/2020  A/P:  Controlled. Continue current medications.    # Hyperglycemia-fasting sugars low 100s but last A1c 2016 5.2 S: Medication: none Exercise and diet- see above Lab Results  Component Value Date   HGBA1C 5.2 07/22/2014   HGBA1C 5.3 06/02/2013   HGBA1C 5.1 03/26/2006  A/P: Consider A1c next year to get more information-blood sugars have been stable in low 100s-continue efforts for healthy eating/regular exercise  #Cold sores-acyclovir as needed/sparing use  #History of skin cancer-has seen [facility] dermatology in the past   #Ocular migraine-diagnosed by ophthalmology March 2021 severe floater x2 at that time. No issues since that time     vertigo     Several episodes of vertigo within the last year. Each episodes vary in length from several mins to several hours. Has an upcoming appt with Dr. Benjamine Mola 02/03/21  1st episode of this in march. Was advised to go to urgent care or ED- took almost a half a day and was poor experience. Was given meclizine- he was doing better by  time he received medication. Improves with laying down/staying still- worsens with movement- but even laying down it lingered through afternoon even just being in bed- 3 hours. Did home epley maneuver and threw up- intense vertigo. Diagnosis was listed as dizziness though some concern allergies/congestion playing a role- rxflonase advised by urgent care  Then 1.5 months ago had another episode. Meclizine helped  some- 2nd episode was more intense. Went to lay down and helpful.  Muffled ear association on left ear. Hearing change in that ear when muffled but right now not having issues and no hearing loss. Some baseline tinnitus not worsening. No worsening since that time. No cold before either episode.   Seen by PA Alyssa Allward there on 10/26/20 and taking astelin and or flonase has been helpful- has not had episode since that time.    ROS- No facial or extremity weakness. No slurred words or trouble swallowing. no blurry vision or double vision. No paresthesias. No confusion or word finding difficulties.   Since the episode lasted several hours and was persistent even with staying still I am concerned this may be something other than BPPV.  No history of migraine so doubt vestibular neuritis.  No preceding viral illness to suggest vestibular neuritis.  Associated with a muffled sound in the left ear-I wonder about Mnire's disease-I am thankful he is going to be seeing ENT soon.  We discussed doing a low-salt diet in the meantime in case this is Mnire's-handout was given on this topic.   Recommended follow up: 1 year CPE- but offered 6 month check up/follow up  Future Appointments  Date Time Provider Coleman  01/06/2021  8:45 AM [facility]    Flu shot given in office today    Lab/Order associations: fasting but already had labs   ICD-10-CM   1. Preventative health care  Z00.00     2. Essential hypertension  I10     3. Hypothyroidism,  unspecified type  E03.9     4. Hyperglycemia  R73.9     5. Hyperlipidemia, unspecified hyperlipidemia type  E78.5       Meds ordered this encounter  Medications   acyclovir (ZOVIRAX) 400 MG tablet    Sig: Take 1 tablet (400 mg total) by mouth 2 (two) times daily as needed (cold sores).    Dispense:  180 tablet    Refill:  3   levothyroxine (SYNTHROID) 100 MCG tablet    Sig: Take 1 tablet (100 mcg total) by mouth daily.    Dispense:  100 tablet    Refill:  4   losartan (COZAAR) 50 MG tablet    Sig: Take 1 tablet (50 mg total) by mouth daily.    Dispense:  100 tablet    Refill:  4   simvastatin (ZOCOR) 20 MG tablet    Sig: Take 1 tablet (20 mg total) by mouth every evening.    Dispense:  100 tablet    Refill:  4   azelastine (ASTELIN) 0.1 % nasal spray    Sig: Place 2 sprays into both nostrils 2 (two) times daily. Use in each nostril as directed    Dispense:  90 mL    Refill:  3    I,Harris Phan,acting as a scribe for Garret Reddish, MD.,have documented all relevant documentation on the behalf of Garret Reddish, MD,as directed by  Garret Reddish, MD while in the presence of Garret Reddish, MD.  I, Garret Reddish, MD, have reviewed all documentation for this visit. The documentation on 12/08/20 for the exam, diagnosis, procedures, and orders are all accurate and complete.  Return precautions advised.  Garret Reddish, MD

## 2020-12-08 ENCOUNTER — Ambulatory Visit (INDEPENDENT_AMBULATORY_CARE_PROVIDER_SITE_OTHER): Payer: Medicare Other | Admitting: Family Medicine

## 2020-12-08 ENCOUNTER — Encounter: Payer: Self-pay | Admitting: Family Medicine

## 2020-12-08 ENCOUNTER — Other Ambulatory Visit: Payer: Self-pay

## 2020-12-08 VITALS — BP 108/60 | HR 70 | Temp 97.1°F | Wt 189.8 lb

## 2020-12-08 DIAGNOSIS — E039 Hypothyroidism, unspecified: Secondary | ICD-10-CM | POA: Diagnosis not present

## 2020-12-08 DIAGNOSIS — R739 Hyperglycemia, unspecified: Secondary | ICD-10-CM

## 2020-12-08 DIAGNOSIS — I10 Essential (primary) hypertension: Secondary | ICD-10-CM

## 2020-12-08 DIAGNOSIS — Z23 Encounter for immunization: Secondary | ICD-10-CM | POA: Diagnosis not present

## 2020-12-08 DIAGNOSIS — Z0000 Encounter for general adult medical examination without abnormal findings: Secondary | ICD-10-CM

## 2020-12-08 DIAGNOSIS — E785 Hyperlipidemia, unspecified: Secondary | ICD-10-CM

## 2020-12-08 MED ORDER — ACYCLOVIR 400 MG PO TABS: 400.0000 mg | ORAL_TABLET | Freq: Two times a day (BID) | ORAL | 3 refills | Status: DC | PRN

## 2020-12-08 MED ORDER — LOSARTAN POTASSIUM 50 MG PO TABS: 50.0000 mg | ORAL_TABLET | Freq: Every day | ORAL | 4 refills | Status: DC

## 2020-12-08 MED ORDER — LEVOTHYROXINE SODIUM 100 MCG PO TABS: 100.0000 ug | ORAL_TABLET | Freq: Every day | ORAL | 4 refills | Status: DC

## 2020-12-08 MED ORDER — SIMVASTATIN 20 MG PO TABS: 20.0000 mg | ORAL_TABLET | Freq: Every evening | ORAL | 4 refills | Status: DC

## 2020-12-08 MED ORDER — AZELASTINE HCL 0.1 % NA SOLN: 2.0000 | Freq: Two times a day (BID) | NASAL | 3 refills | Status: DC

## 2020-12-15 ENCOUNTER — Encounter: Payer: Self-pay | Admitting: Family Medicine

## 2020-12-21 ENCOUNTER — Encounter (HOSPITAL_BASED_OUTPATIENT_CLINIC_OR_DEPARTMENT_OTHER): Payer: Self-pay | Admitting: Orthopedic Surgery

## 2020-12-21 ENCOUNTER — Other Ambulatory Visit: Payer: Self-pay

## 2020-12-22 ENCOUNTER — Encounter (HOSPITAL_BASED_OUTPATIENT_CLINIC_OR_DEPARTMENT_OTHER)
Admission: RE | Admit: 2020-12-22 | Discharge: 2020-12-22 | Disposition: A | Discharge status: Home / Self Care | Payer: Medicare Other | Source: Ambulatory Visit | Attending: Orthopedic Surgery | Admitting: Orthopedic Surgery

## 2020-12-22 DIAGNOSIS — Z01810 Encounter for preprocedural cardiovascular examination: Secondary | ICD-10-CM | POA: Insufficient documentation

## 2020-12-22 NOTE — Progress Notes (Signed)

## 2020-12-29 ENCOUNTER — Encounter (HOSPITAL_BASED_OUTPATIENT_CLINIC_OR_DEPARTMENT_OTHER): Payer: Self-pay | Admitting: Orthopedic Surgery

## 2020-12-29 ENCOUNTER — Encounter (HOSPITAL_BASED_OUTPATIENT_CLINIC_OR_DEPARTMENT_OTHER)
Admission: RE | Disposition: A | Discharge status: Home / Self Care | Payer: Self-pay | Source: Home / Self Care | Attending: Orthopedic Surgery

## 2020-12-29 ENCOUNTER — Other Ambulatory Visit: Payer: Self-pay

## 2020-12-29 ENCOUNTER — Ambulatory Visit (HOSPITAL_BASED_OUTPATIENT_CLINIC_OR_DEPARTMENT_OTHER)
Admission: RE | Admit: 2020-12-29 | Discharge: 2020-12-29 | Disposition: A | Discharge status: Home / Self Care | Payer: Medicare Other | Attending: Orthopedic Surgery | Admitting: Orthopedic Surgery

## 2020-12-29 ENCOUNTER — Ambulatory Visit (HOSPITAL_BASED_OUTPATIENT_CLINIC_OR_DEPARTMENT_OTHER): Payer: Medicare Other | Admitting: Anesthesiology

## 2020-12-29 DIAGNOSIS — M66372 Spontaneous rupture of flexor tendons, left ankle and foot: Secondary | ICD-10-CM | POA: Diagnosis present

## 2020-12-29 HISTORY — PX: REPAIR OF PERONEUS BREVIS TENDON: SHX6215

## 2020-12-29 SURGERY — REPAIR, TENDON, PERONEUS BREVIS
Anesthesia: General | Site: Ankle | Laterality: Left

## 2020-12-29 MED ORDER — FENTANYL CITRATE (PF) 100 MCG/2ML IJ SOLN
100.0000 ug | Freq: Once | INTRAMUSCULAR | Status: AC
  Administered 2020-12-29: 100 ug via INTRAVENOUS

## 2020-12-29 MED ORDER — PROMETHAZINE HCL 25 MG/ML IJ SOLN: 6.2500 mg | INTRAMUSCULAR | Status: DC | PRN

## 2020-12-29 MED ORDER — PROPOFOL 10 MG/ML IV BOLUS
INTRAVENOUS | Status: DC | PRN
  Administered 2020-12-29: 200 mg via INTRAVENOUS

## 2020-12-29 MED ORDER — DROPERIDOL 2.5 MG/ML IJ SOLN
INTRAMUSCULAR | Status: DC | PRN
  Administered 2020-12-29: .625 mg via INTRAVENOUS

## 2020-12-29 MED ORDER — DOCUSATE SODIUM 100 MG PO CAPS: 100.0000 mg | ORAL_CAPSULE | Freq: Two times a day (BID) | ORAL | 0 refills | Status: DC

## 2020-12-29 MED ORDER — SENNA 8.6 MG PO TABS: 2.0000 | ORAL_TABLET | Freq: Two times a day (BID) | ORAL | 0 refills | Status: DC

## 2020-12-29 MED ORDER — SODIUM CHLORIDE 0.9 % IV SOLN: INTRAVENOUS | Status: DC

## 2020-12-29 MED ORDER — MIDAZOLAM HCL 2 MG/2ML IJ SOLN
2.0000 mg | Freq: Once | INTRAMUSCULAR | Status: AC
  Administered 2020-12-29: 2 mg via INTRAVENOUS

## 2020-12-29 MED ORDER — OXYCODONE HCL 5 MG/5ML PO SOLN: 5.0000 mg | Freq: Once | ORAL | Status: DC | PRN

## 2020-12-29 MED ORDER — HYDROMORPHONE HCL 1 MG/ML IJ SOLN: 0.2500 mg | INTRAMUSCULAR | Status: DC | PRN

## 2020-12-29 MED ORDER — MEPERIDINE HCL 25 MG/ML IJ SOLN: 6.2500 mg | INTRAMUSCULAR | Status: DC | PRN

## 2020-12-29 MED ORDER — FENTANYL CITRATE (PF) 100 MCG/2ML IJ SOLN
INTRAMUSCULAR | Status: AC
  Filled 2020-12-29: qty 2

## 2020-12-29 MED ORDER — PROPOFOL 500 MG/50ML IV EMUL
INTRAVENOUS | Status: DC | PRN
  Administered 2020-12-29: 25 ug/kg/min via INTRAVENOUS

## 2020-12-29 MED ORDER — VANCOMYCIN HCL 500 MG IV SOLR
INTRAVENOUS | Status: DC | PRN
  Administered 2020-12-29: 500 mg via TOPICAL

## 2020-12-29 MED ORDER — MIDAZOLAM HCL 2 MG/2ML IJ SOLN
INTRAMUSCULAR | Status: AC
  Filled 2020-12-29: qty 2

## 2020-12-29 MED ORDER — ASPIRIN EC 81 MG PO TBEC: 81.0000 mg | DELAYED_RELEASE_TABLET | Freq: Two times a day (BID) | ORAL | 0 refills | Status: AC

## 2020-12-29 MED ORDER — OXYCODONE HCL 5 MG PO TABS: 5.0000 mg | ORAL_TABLET | Freq: Once | ORAL | Status: DC | PRN

## 2020-12-29 MED ORDER — 0.9 % SODIUM CHLORIDE (POUR BTL) OPTIME
TOPICAL | Status: DC | PRN
  Administered 2020-12-29: 1000 mL

## 2020-12-29 MED ORDER — LIDOCAINE HCL (CARDIAC) PF 100 MG/5ML IV SOSY
PREFILLED_SYRINGE | INTRAVENOUS | Status: DC | PRN
  Administered 2020-12-29: 30 mg via INTRAVENOUS

## 2020-12-29 MED ORDER — ROPIVACAINE HCL 5 MG/ML IJ SOLN
INTRAMUSCULAR | Status: DC | PRN
  Administered 2020-12-29: 30 mL via PERINEURAL

## 2020-12-29 MED ORDER — LACTATED RINGERS IV SOLN: INTRAVENOUS | Status: DC

## 2020-12-29 MED ORDER — CEFAZOLIN SODIUM-DEXTROSE 2-4 GM/100ML-% IV SOLN
2.0000 g | INTRAVENOUS | Status: AC
  Administered 2020-12-29: 2 g via INTRAVENOUS

## 2020-12-29 MED ORDER — OXYCODONE HCL 5 MG PO TABS: 5.0000 mg | ORAL_TABLET | ORAL | 0 refills | Status: AC | PRN

## 2020-12-29 MED ORDER — AMISULPRIDE (ANTIEMETIC) 5 MG/2ML IV SOLN: 10.0000 mg | Freq: Once | INTRAVENOUS | Status: DC | PRN

## 2020-12-29 MED ORDER — ONDANSETRON HCL 4 MG/2ML IJ SOLN
INTRAMUSCULAR | Status: DC | PRN
Start: 2020-12-29 — End: 2020-12-29
  Administered 2020-12-29: 4 mg via INTRAVENOUS

## 2020-12-29 MED ORDER — DEXAMETHASONE SODIUM PHOSPHATE 10 MG/ML IJ SOLN
INTRAMUSCULAR | Status: DC | PRN
  Administered 2020-12-29: 4 mg via INTRAVENOUS

## 2020-12-29 MED ORDER — CEFAZOLIN SODIUM-DEXTROSE 2-4 GM/100ML-% IV SOLN
INTRAVENOUS | Status: AC
  Filled 2020-12-29: qty 100

## 2020-12-29 SURGICAL SUPPLY — 82 items
APL PRP STRL LF DISP 70% ISPRP (MISCELLANEOUS) ×1
BANDAGE ESMARK 6X9 LF (GAUZE/BANDAGES/DRESSINGS) ×1 IMPLANT
BLADE AVERAGE 25X9 (BLADE) IMPLANT
BLADE SURG 15 STRL LF DISP TIS (BLADE) ×2 IMPLANT
BLADE SURG 15 STRL SS (BLADE) ×4
BNDG CMPR 9X4 STRL LF SNTH (GAUZE/BANDAGES/DRESSINGS)
BNDG CMPR 9X6 STRL LF SNTH (GAUZE/BANDAGES/DRESSINGS)
BNDG COHESIVE 4X5 TAN ST LF (GAUZE/BANDAGES/DRESSINGS) ×1 IMPLANT
BNDG COHESIVE 6X5 TAN ST LF (GAUZE/BANDAGES/DRESSINGS) ×1 IMPLANT
BNDG ESMARK 4X9 LF (GAUZE/BANDAGES/DRESSINGS) IMPLANT
BNDG ESMARK 6X9 LF (GAUZE/BANDAGES/DRESSINGS)
CHLORAPREP W/TINT 26 (MISCELLANEOUS) ×2 IMPLANT
COVER BACK TABLE 60X90IN (DRAPES) ×2 IMPLANT
CUFF TOURN SGL QUICK 34 (TOURNIQUET CUFF) ×2
CUFF TRNQT CYL 34X4.125X (TOURNIQUET CUFF) ×1 IMPLANT
DECANTER SPIKE VIAL GLASS SM (MISCELLANEOUS) IMPLANT
DRAPE C-ARM 42X72 X-RAY (DRAPES) IMPLANT
DRAPE EXTREMITY T 121X128X90 (DISPOSABLE) ×2 IMPLANT
DRAPE OEC MINIVIEW 54X84 (DRAPES) IMPLANT
DRAPE U-SHAPE 47X51 STRL (DRAPES) ×1 IMPLANT
DRSG MEPITEL 4X7.2 (GAUZE/BANDAGES/DRESSINGS) ×1 IMPLANT
DRSG PAD ABDOMINAL 8X10 ST (GAUZE/BANDAGES/DRESSINGS) ×4 IMPLANT
ELECT REM PT RETURN 9FT ADLT (ELECTROSURGICAL) ×2
ELECTRODE REM PT RTRN 9FT ADLT (ELECTROSURGICAL) ×1 IMPLANT
GAUZE SPONGE 4X4 12PLY STRL (GAUZE/BANDAGES/DRESSINGS) ×2 IMPLANT
GLOVE SRG 8 PF TXTR STRL LF DI (GLOVE) ×2 IMPLANT
GLOVE SURG ENC MOIS LTX SZ8 (GLOVE) ×2 IMPLANT
GLOVE SURG LTX SZ8 (GLOVE) ×2 IMPLANT
GLOVE SURG POLYISO LF SZ6.5 (GLOVE) ×1 IMPLANT
GLOVE SURG POLYISO LF SZ7 (GLOVE) ×1 IMPLANT
GLOVE SURG UNDER POLY LF SZ6.5 (GLOVE) ×1 IMPLANT
GLOVE SURG UNDER POLY LF SZ7 (GLOVE) ×2 IMPLANT
GLOVE SURG UNDER POLY LF SZ8 (GLOVE) ×4
GOWN STRL REUS W/ TWL LRG LVL3 (GOWN DISPOSABLE) ×1 IMPLANT
GOWN STRL REUS W/ TWL XL LVL3 (GOWN DISPOSABLE) ×2 IMPLANT
GOWN STRL REUS W/TWL LRG LVL3 (GOWN DISPOSABLE) ×6
GOWN STRL REUS W/TWL XL LVL3 (GOWN DISPOSABLE) ×4
GRAFT TISS 230-320 GRACILIS (Bone Implant) IMPLANT
K-WIRE .062X4 (WIRE) ×1 IMPLANT
NDL SUT 6 .5 CRC .975X.05 MAYO (NEEDLE) IMPLANT
NEEDLE HYPO 22GX1.5 SAFETY (NEEDLE) IMPLANT
NEEDLE MAYO TAPER (NEEDLE)
NS IRRIG 1000ML POUR BTL (IV SOLUTION) ×2 IMPLANT
PACK BASIN DAY SURGERY FS (CUSTOM PROCEDURE TRAY) ×2 IMPLANT
PAD CAST 4YDX4 CTTN HI CHSV (CAST SUPPLIES) ×1 IMPLANT
PADDING CAST ABS 4INX4YD NS (CAST SUPPLIES)
PADDING CAST ABS COTTON 4X4 ST (CAST SUPPLIES) IMPLANT
PADDING CAST COTTON 4X4 STRL (CAST SUPPLIES) ×2
PADDING CAST COTTON 6X4 STRL (CAST SUPPLIES) ×2 IMPLANT
PASSER SUT SWANSON 36MM LOOP (INSTRUMENTS) IMPLANT
PENCIL SMOKE EVACUATOR (MISCELLANEOUS) ×2 IMPLANT
RETRIEVER SUT HEWSON (MISCELLANEOUS) IMPLANT
SANITIZER HAND PURELL 535ML FO (MISCELLANEOUS) ×2 IMPLANT
SCOTCHCAST PLUS 4X4 WHITE (CAST SUPPLIES) IMPLANT
SHEET MEDIUM DRAPE 40X70 STRL (DRAPES) ×2 IMPLANT
SLEEVE SCD COMPRESS KNEE MED (STOCKING) ×2 IMPLANT
SPLINT FAST PLASTER 5X30 (CAST SUPPLIES) ×20
SPLINT PLASTER CAST FAST 5X30 (CAST SUPPLIES) IMPLANT
SPONGE T-LAP 18X18 ~~LOC~~+RFID (SPONGE) ×2 IMPLANT
STOCKINETTE 6  STRL (DRAPES) ×2
STOCKINETTE 6 STRL (DRAPES) ×1 IMPLANT
SUCTION FRAZIER HANDLE 10FR (MISCELLANEOUS) ×2
SUCTION TUBE FRAZIER 10FR DISP (MISCELLANEOUS) IMPLANT
SUT ETHIBOND 0 MO6 C/R (SUTURE) ×1 IMPLANT
SUT ETHIBOND 2 OS 4 DA (SUTURE) IMPLANT
SUT ETHILON 3 0 PS 1 (SUTURE) ×2 IMPLANT
SUT FIBERWIRE 2-0 18 17.9 3/8 (SUTURE)
SUT MERSILENE 2.0 SH NDLE (SUTURE) IMPLANT
SUT MNCRL AB 3-0 PS2 18 (SUTURE) ×4 IMPLANT
SUT VIC AB 0 SH 27 (SUTURE) IMPLANT
SUT VIC AB 2-0 SH 27 (SUTURE)
SUT VIC AB 2-0 SH 27XBRD (SUTURE) IMPLANT
SUT VICRYL 0 SH 27 (SUTURE) ×1 IMPLANT
SUT VICRYL 0 UR6 27IN ABS (SUTURE) IMPLANT
SUTURE FIBERWR 2-0 18 17.9 3/8 (SUTURE) IMPLANT
SYR BULB EAR ULCER 3OZ GRN STR (SYRINGE) ×2 IMPLANT
SYR CONTROL 10ML LL (SYRINGE) IMPLANT
TENDON GRACILIS FROZEN (Bone Implant) ×2 IMPLANT
TENDON GRACILIS FROZEN 230-320 (Bone Implant) ×1 IMPLANT
TOWEL GREEN STERILE FF (TOWEL DISPOSABLE) ×2 IMPLANT
TUBE CONNECTING 20X1/4 (TUBING) IMPLANT
UNDERPAD 30X36 HEAVY ABSORB (UNDERPADS AND DIAPERS) ×2 IMPLANT

## 2020-12-29 NOTE — H&P (Signed)
Alexander Griffith is an 70 y.o. male.   Chief Complaint: left ankle pain HPI: 70 year old male without significant past medical history presents today with a long history of left lateral ankle and hindfoot pain.  He has failed nonoperative treatment to date including activity modification, oral anti-inflammatories and physical therapy.  MRI shows tearing and degenerative changes of both peroneus longus and peroneus brevis.  He presents today for repair or reconstruction of the peroneal tendons.  Past Medical History:  Diagnosis Date   Bilateral cataracts 09/19/2017   Colon polyps    1990s   Diverticulosis of sigmoid colon    noted on 06/2010 colonoscopy.   History of skin cancer    skin   HSV infection    acyclovir for cold sores   Hyperlipidemia    Hypertension    Hypothyroidism     Past Surgical History:  Procedure Laterality Date   ACHILLES TENDON REPAIR     ANTERIOR CRUCIATE LIGAMENT REPAIR     right, 09   BASAL CELL CARCINOMA EXCISION     05/2010   CATARACT EXTRACTION Bilateral 2019   COLONOSCOPY     Dupuytren's Contracture Bilateral    INGUINAL HERNIA REPAIR Left 07/01/2019   Procedure: LEFT INGUINAL HERNIA WITH MESH;  Surgeon: Erroll Luna, MD;  Location: [facility];  Service: General;  Laterality: Left;  GENERAL AND TAP BLOCK   POLYPECTOMY     in 1990s, adenomatous colon polyp   SHOULDER ARTHROSCOPY Left 02/09/2019    Family History  Problem Relation Age of Onset   Colon cancer Paternal Grandfather    Hypertension Mother        died at 36   Stroke Maternal Grandfather    Heart disease Father        heart attack 37, smoking   Lung cancer Father        2 years after heart attack   Other Daughter        accidental death- high level delsym ingredient   Esophageal cancer Neg Hx    Rectal cancer Neg Hx    Stomach cancer Neg Hx    Social History:  reports that he has never smoked. He has never used smokeless tobacco. He reports current alcohol use.  He reports that he does not use drugs.  Allergies: No Known Allergies  Medications Prior to Admission  Medication Sig Dispense Refill   aspirin 81 MG tablet Take 81 mg by mouth daily.     azelastine (ASTELIN) 0.1 % nasal spray Place 2 sprays into both nostrils 2 (two) times daily. Use in each nostril as directed 90 mL 3   Biotin 300 MCG TABS Take by mouth 2 (two) times daily.     co-enzyme Q-10 30 MG capsule Take 30 mg by mouth daily.     diphenhydrAMINE (BENADRYL) 25 mg capsule      fish oil-omega-3 fatty acids 1000 MG capsule Take 1 g by mouth daily.     glucosamine-chondroitin 500-400 MG tablet Take 2 tablets by mouth daily.     levothyroxine (SYNTHROID) 100 MCG tablet Take 1 tablet (100 mcg total) by mouth daily. 100 tablet 4   losartan (COZAAR) 50 MG tablet Take 1 tablet (50 mg total) by mouth daily. 100 tablet 4   simvastatin (ZOCOR) 20 MG tablet Take 1 tablet (20 mg total) by mouth every evening. 100 tablet 4   acyclovir (ZOVIRAX) 400 MG tablet Take 1 tablet (400 mg total) by mouth 2 (two) times daily  as needed (cold sores). 180 tablet 3    No results found for this or any previous visit (from the past 48 hour(s)). No results found.  Review of Systems no recent fever, chills, nausea, vomiting or changes in his appetite  Blood pressure 123/76, pulse (!) 49, temperature 98 F (36.7 C), temperature source Oral, resp. rate 11, height 5\' 8"  (1.727 m), weight 85.3 kg, SpO2 95 %. Physical Exam  Well-nourished well-developed man in no apparent distress.  Alert and oriented x4.  Normal mood and affect.  Extraocular motions are intact.  Respirations are unlabored.  Left ankle with tenderness to palpation and swelling along the peroneal tendons.  Skin is healthy and intact.  Pulses are palpable.  Normal sensibility to light touch in the sural nerve distribution.   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  The risks and benefits of the alternative treatment options have been discussed in detail.  The patient wishes to proceed with surgery and specifically understands risks of bleeding, infection, nerve damage, blood clots, need for additional surgery, amputation and death.   Wylene Simmer, MD 01-10-2021, 8:35 AM

## 2020-12-29 NOTE — Transfer of Care (Signed)
Immediate Anesthesia Transfer of Care Note  Patient: Alexander Griffith  Procedure(s) Performed: LEFT ANKLE PERONEUS BREVIS AND LONGUS TENDON RECONSTRUCTION (Left: Ankle)  Patient Location: PACU  Anesthesia Type:GA combined with regional for post-op pain  Level of Consciousness: drowsy and patient cooperative  Airway & Oxygen Therapy: Patient Spontanous Breathing and Patient connected to face mask oxygen  Post-op Assessment: Report given to RN and Post -op Vital signs reviewed and stable  Post vital signs: Reviewed and stable  Last Vitals:  Vitals Value Taken Time  BP 102/63 12/29/20 1035  Temp    Pulse 69 12/29/20 1036  Resp 16 12/29/20 1036  SpO2 97 % 12/29/20 1036  Vitals shown include unvalidated device data.  Last Pain:  Vitals:   12/29/20 0734  TempSrc: Oral  PainSc: 0-No pain         Complications: No notable events documented.

## 2020-12-29 NOTE — Anesthesia Postprocedure Evaluation (Signed)
Anesthesia Post Note  Patient: Alexander Griffith  Procedure(s) Performed: LEFT ANKLE PERONEUS BREVIS AND LONGUS TENDON RECONSTRUCTION (Left: Ankle)     Patient location during evaluation: PACU Anesthesia Type: General Level of consciousness: awake and alert Pain management: pain level controlled Vital Signs Assessment: post-procedure vital signs reviewed and stable Respiratory status: spontaneous breathing, nonlabored ventilation and respiratory function stable Cardiovascular status: blood pressure returned to baseline and stable Postop Assessment: no apparent nausea or vomiting Anesthetic complications: no   No notable events documented.  Last Vitals:  Vitals:   12/29/20 1100 12/29/20 1130  BP:  114/73  Pulse:  70  Resp:  16  Temp:  36.4 C  SpO2: 97% 99%    Last Pain:  Vitals:   12/29/20 1130  TempSrc:   PainSc: 0-No pain                 Lynda Rainwater

## 2020-12-29 NOTE — Anesthesia Procedure Notes (Signed)
Anesthesia Regional Block: Popliteal block   Pre-Anesthetic Checklist: , timeout performed,  Correct Patient, Correct Site, Correct Laterality,  Correct Procedure, Correct Position, site marked,  Risks and benefits discussed,  Surgical consent,  Pre-op evaluation,  At surgeon's request and post-op pain management  Laterality: Left  Prep: chloraprep       Needles:  Injection technique: Single-shot  Needle Type: Stimiplex     Needle Length: 9cm  Needle Gauge: 21     Additional Needles:   Procedures:,,,, ultrasound used (permanent image in chart),,    Narrative:  Start time: 12/29/2020 8:24 AM End time: 12/29/2020 8:29 AM Injection made incrementally with aspirations every 5 mL.  Performed by: Personally  Anesthesiologist: Lynda Rainwater, MD

## 2020-12-29 NOTE — Op Note (Signed)
12/29/2020  10:44 AM  PATIENT:  Alexander Griffith  70 y.o. male  PRE-OPERATIVE DIAGNOSIS:  1.  Left peroneus brevis rupture 2.  Left peroneus longus rupture  POST-OPERATIVE DIAGNOSIS: Same  Procedure(s): 1.  Reconstruction of the left peroneus longus and brevis tendons with allograft 2.  Exostectomy of the lateral wall of the calcaneus  SURGEON:  Wylene Simmer, MD  ASSISTANT: Mechele Claude, PA-C  ANESTHESIA:   General, regional  EBL:  minimal   TOURNIQUET:   Total Tourniquet Time Documented: Thigh (Left) - 75 minutes Total: Thigh (Left) - 75 minutes  COMPLICATIONS:  None apparent  DISPOSITION:  Extubated, awake and stable to recovery.  INDICATION FOR PROCEDURE: 70 year old male without significant past medical history has a long history of left ankle and lateral hindfoot pain.  Physical exam findings and MRI revealed rupture of the peroneus brevis and longus tendons.  He has failed nonoperative treatment to date including activity modification, bracing, physical therapy and oral anti-inflammatories.  He presents now for surgical treatment of these painful tendon injuries.  The risks and benefits of the alternative treatment options have been discussed in detail.  The patient wishes to proceed with surgery and specifically understands risks of bleeding, infection, nerve damage, blood clots, need for additional surgery, amputation and death.   PROCEDURE IN DETAIL:  After pre operative consent was obtained, and the correct operative site was identified, the patient was brought to the operating room and placed supine on the OR table.  Anesthesia was administered.  Pre-operative antibiotics were administered.  A surgical timeout was taken.  The left lower extremity was prepped and draped in standard sterile fashion with a tourniquet around the thigh.  The extremity was elevated and the tourniquet inflated to 250 mmHg.  A curvilinear incision was made along the course of the peroneal tendons  from the retrofibular groove to the tip of the fifth metatarsal.  Dissection was carried sharply down through the subcutaneous tissues.  Care was taken to protect branches of the sural nerve.  The peroneal tendon sheath was incised.  The superior peroneal retinaculum was taken down from the insertion on the posterior fibula in subperiosteal fashion.  Dissection was then carried along the peroneals to the insertion at the base of the fifth metatarsal.  The brevis was noted to be completely ruptured with retraction of the degenerated tendon proximally.  The distal stump appeared generally healthy.  The distal stump of the longus was identified at the cuboid.  It was intact with some degenerative changes proximally.  This was debrided with scissors.  The longus was noted to be ruptured and was retracted proximally.  The ruptured end of the tendon was cleaned up with scissors.  Careful tenolysis was carried along both the brevis and longus proximally until they were freed up and moved easily within the tendon sheath.  A hypertrophic peroneal tubercle was identified at the lateral wall of the calcaneus.  This was debrided with a rondure.  A gracilis allograft tendon was then woven through the stump of the peroneus longus distally with a Pulvertaft weave.  It was sewn in place with horizontal mattress and figure-of-eight sutures of 0 Ethibond.  The allograft tendon was then passed through the proximal end of the stump again in a Pulvertaft weave.  This was sewn in with 0 Ethibond restoring the appropriate tension of the peroneus longus tendon.  The tendon repair was then oversewn with 3-0 Monocryl running Silfverskiold circumferential suture.  The tendon was then woven  through the distal stump of the peroneus brevis and sewn in place as described above with 0 Ethibond.  The tendon was woven through the proximal end of the stump and doubled back on itself.  It was then sewn in place along its full length with 0 Ethibond  and oversewn with 3-0 Monocryl again using a Silfverskiold circumferential suture.  Resting tension of the peroneals was noted to be appropriate.  They were reduced back to the retrofibular groove.  The superior peroneal retinaculum was repaired with imbricating sutures of 0 Ethibond through drill holes in the fibula.  The wound was irrigated copiously and sprinkled with vancomycin powder.  Deep subcutaneous tissues were approximated with 3-0 Monocryl.  The skin incision was closed with 3-0 nylon.  Sterile dressings were applied followed by well-padded short leg splint.  The tourniquet was released after application of the dressings.  The patient was awakened from anesthesia and transported to the recovery room in stable condition.   FOLLOW UP PLAN: Nonweightbearing on the left lower extremity.  Follow-up in the office in 2 weeks for suture removal and transition to a cam boot for weightbearing and gentle active plantar flexion and dorsiflexion range of motion.    Mechele Claude PA-C was present and scrubbed for the duration of the operative case. His assistance was essential in positioning the patient, prepping and draping, gaining and maintaining exposure, performing the operation, closing and dressing the wounds and applying the splint.

## 2020-12-29 NOTE — Anesthesia Procedure Notes (Signed)
Procedure Name: LMA Insertion Date/Time: 12/29/2020 8:57 AM Performed by: Signe Colt, CRNA Pre-anesthesia Checklist: Patient identified, Emergency Drugs available, Suction available and Patient being monitored Patient Re-evaluated:Patient Re-evaluated prior to induction Oxygen Delivery Method: Circle System Utilized Preoxygenation: Pre-oxygenation with 100% oxygen Induction Type: IV induction Ventilation: Mask ventilation without difficulty LMA: LMA inserted LMA Size: 4.0 Number of attempts: 1 Airway Equipment and Method: bite block Placement Confirmation: positive ETCO2 Tube secured with: Tape Dental Injury: Teeth and Oropharynx as per pre-operative assessment

## 2020-12-29 NOTE — Progress Notes (Signed)
Assisted Dr. Miller with left, ultrasound guided, popliteal block. Side rails up, monitors on throughout procedure. See vital signs in flow sheet. Tolerated Procedure well. 

## 2020-12-29 NOTE — Discharge Instructions (Addendum)
Post Anesthesia Home Care Instructions  Activity: Get plenty of rest for the remainder of the day. A responsible individual must stay with you for 24 hours following the procedure.  For the next 24 hours, DO NOT: -Drive a car -Paediatric nurse -Drink alcoholic beverages -Take any medication unless instructed by your physician -Make any legal decisions or sign important papers.  Meals: Start with liquid foods such as gelatin or soup. Progress to regular foods as tolerated. Avoid greasy, spicy, heavy foods. If nausea and/or vomiting occur, drink only clear liquids until the nausea and/or vomiting subsides. Call your physician if vomiting continues.  Special Instructions/Symptoms: Your throat may feel dry or sore from the anesthesia or the breathing tube placed in your throat during surgery. If this causes discomfort, gargle with warm salt water. The discomfort should disappear within 24 hours.  If you had a scopolamine patch placed behind your ear for the management of post- operative nausea and/or vomiting:  1. The medication in the patch is effective for 72 hours, after which it should be removed.  Wrap patch in a tissue and discard in the trash. Wash hands thoroughly with soap and water. 2. You may remove the patch earlier than 72 hours if you experience unpleasant side effects which may include dry mouth, dizziness or visual disturbances. 3. Avoid touching the patch. Wash your hands with soap and water after contact with the patch.  Regional Anesthesia Blocks  1. Numbness or the inability to move the "blocked" extremity may last from 3-48 hours after placement. The length of time depends on the medication injected and your individual response to the medication. If the numbness is not going away after 48 hours, call your surgeon.  2. The extremity that is blocked will need to be protected until the numbness is gone and the  Strength has returned. Because you cannot feel it, you will  need to take extra care to avoid injury. Because it may be weak, you may have difficulty moving it or using it. You may not know what position it is in without looking at it while the block is in effect.  3. For blocks in the legs and feet, returning to weight bearing and walking needs to be done carefully. You will need to wait until the numbness is entirely gone and the strength has returned. You should be able to move your leg and foot normally before you try and bear weight or walk. You will need someone to be with you when you first try to ensure you do not fall and possibly risk injury.  4. Bruising and tenderness at the needle site are common side effects and will resolve in a few days.  5. Persistent numbness or new problems with movement should be communicated to the surgeon or the [place] [phone] [place] [phone]).       Wylene Simmer, MD [facility]  Please read the following information regarding your care after surgery.  Medications  You only need a prescription for the narcotic pain medicine (ex. oxycodone, Percocet, Norco).  All of the other medicines listed below are available over the counter. ? Aleve 2 pills twice a day for the first 3 days after surgery. ? acetominophen (Tylenol) 650 mg every 4-6 hours as you need for minor to moderate pain ? oxycodone as prescribed for severe pain  Narcotic pain medicine (ex. oxycodone, Percocet, Vicodin) will cause constipation.  To prevent this problem, take the following medicines while you are  taking any pain medicine. ? docusate sodium (Colace) 100 mg twice a day ? senna (Senokot) 2 tablets twice a day  ? To help prevent blood clots, take a baby aspirin (81 mg) twice a day for two weeks after surgery.  You should also get up every hour while you are awake to move around.    Weight Bearing ? Do not bear any weight on the operated leg or foot.  Cast / Splint / Dressing ? Keep your splint,  cast or dressing clean and dry.  Don't put anything (coat hanger, pencil, etc) down inside of it.  If it gets damp, use a hair dryer on the cool setting to dry it.  If it gets soaked, call the office to schedule an appointment for a cast change.   After your dressing, cast or splint is removed; you may shower, but do not soak or scrub the wound.  Allow the water to run over it, and then gently pat it dry.  Swelling It is normal for you to have swelling where you had surgery.  To reduce swelling and pain, keep your toes above your nose for at least 3 days after surgery.  It may be necessary to keep your foot or leg elevated for several weeks.  If it hurts, it should be elevated.  Follow Up Call my office at [phone] when you are discharged from the hospital or surgery center to schedule an appointment to be seen two weeks after surgery.  Call my office at [phone] if you develop a fever >101.5 F, nausea, vomiting, bleeding from the surgical site or severe pain.     Post Anesthesia Home Care Instructions  Activity: Get plenty of rest for the remainder of the day. A responsible individual must stay with you for 24 hours following the procedure.  For the next 24 hours, DO NOT: -Drive a car -Paediatric nurse -Drink alcoholic beverages -Take any medication unless instructed by your physician -Make any legal decisions or sign important papers.  Meals: Start with liquid foods such as gelatin or soup. Progress to regular foods as tolerated. Avoid greasy, spicy, heavy foods. If nausea and/or vomiting occur, drink only clear liquids until the nausea and/or vomiting subsides. Call your physician if vomiting continues.  Special Instructions/Symptoms: Your throat may feel dry or sore from the anesthesia or the breathing tube placed in your throat during surgery. If this causes discomfort, gargle with warm salt water. The discomfort should disappear within 24 hours.      Regional  Anesthesia Blocks  1. Numbness or the inability to move the "blocked" extremity may last from 3-48 hours after placement. The length of time depends on the medication injected and your individual response to the medication. If the numbness is not going away after 48 hours, call your surgeon.  2. The extremity that is blocked will need to be protected until the numbness is gone and the  Strength has returned. Because you cannot feel it, you will need to take extra care to avoid injury. Because it may be weak, you may have difficulty moving it or using it. You may not know what position it is in without looking at it while the block is in effect.  3. For blocks in the legs and feet, returning to weight bearing and walking needs to be done carefully. You will need to wait until the numbness is entirely gone and the strength has returned. You should be able to move your leg and foot normally  before you try and bear weight or walk. You will need someone to be with you when you first try to ensure you do not fall and possibly risk injury.  4. Bruising and tenderness at the needle site are common side effects and will resolve in a few days.  5. Persistent numbness or new problems with movement should be communicated to the surgeon or the [place] [phone] [place] [phone]).

## 2020-12-29 NOTE — Anesthesia Preprocedure Evaluation (Signed)
Anesthesia Evaluation  Patient identified by MRN, date of birth, ID band Patient awake    Reviewed: Allergy & Precautions, NPO status , Patient's Chart, lab work & pertinent test results  Airway Mallampati: II  TM Distance: >3 FB Neck ROM: Full    Dental no notable dental hx. (+) Teeth Intact   Pulmonary neg pulmonary ROS,    Pulmonary exam normal breath sounds clear to auscultation       Cardiovascular hypertension, Pt. on medications Normal cardiovascular exam Rhythm:Regular Rate:Normal     Neuro/Psych  Headaches, negative psych ROS   GI/Hepatic negative GI ROS, Neg liver ROS,   Endo/Other  Hypothyroidism Hyperlipidemia   Renal/GU negative Renal ROS  negative genitourinary   Musculoskeletal Left inguinal hernia   Abdominal   Peds  Hematology negative hematology ROS (+)   Anesthesia Other Findings   Reproductive/Obstetrics                             Anesthesia Physical  Anesthesia Plan  ASA: II  Anesthesia Plan: General   Post-op Pain Management:  Regional for Post-op pain   Induction: Intravenous  PONV Risk Score and Plan: 2 and Ondansetron, Treatment may vary due to age or medical condition and Midazolam  Airway Management Planned: LMA  Additional Equipment:   Intra-op Plan:   Post-operative Plan: Extubation in OR  Informed Consent: I have reviewed the patients History and Physical, chart, labs and discussed the procedure including the risks, benefits and alternatives for the proposed anesthesia with the patient or authorized representative who has indicated his/her understanding and acceptance.     Dental advisory given  Plan Discussed with: CRNA and Surgeon  Anesthesia Plan Comments:         Anesthesia Quick Evaluation

## 2020-12-30 ENCOUNTER — Encounter (HOSPITAL_BASED_OUTPATIENT_CLINIC_OR_DEPARTMENT_OTHER): Payer: Self-pay | Admitting: Orthopedic Surgery

## 2021-01-06 ENCOUNTER — Ambulatory Visit: Payer: Medicare Other

## 2021-03-09 ENCOUNTER — Other Ambulatory Visit: Payer: Self-pay | Admitting: Otolaryngology

## 2021-03-09 DIAGNOSIS — H918X9 Other specified hearing loss, unspecified ear: Secondary | ICD-10-CM

## 2021-03-10 ENCOUNTER — Other Ambulatory Visit: Payer: Self-pay

## 2021-03-10 ENCOUNTER — Ambulatory Visit
Admission: RE | Admit: 2021-03-10 | Discharge: 2021-03-10 | Disposition: A | Discharge status: Home / Self Care | Payer: Medicare Other | Source: Ambulatory Visit | Attending: Otolaryngology | Admitting: Otolaryngology

## 2021-03-10 DIAGNOSIS — H918X9 Other specified hearing loss, unspecified ear: Secondary | ICD-10-CM

## 2021-03-10 MED ORDER — GADOBENATE DIMEGLUMINE 529 MG/ML IV SOLN
17.0000 mL | Freq: Once | INTRAVENOUS | Status: AC | PRN
  Administered 2021-03-10: 17 mL via INTRAVENOUS

## 2021-05-23 ENCOUNTER — Telehealth: Payer: Self-pay | Admitting: Family Medicine

## 2021-05-23 NOTE — Telephone Encounter (Signed)
Copied from [place] [phone] [phone]. Topic: Medicare AWV ?>> May 23, 2021  9:57 AM Harris-Coley, Hannah Beat wrote: ?Reason for CRM: Left message for patient to schedule Annual Wellness Visit.  Please schedule with Nurse Health Advisor Charlott Rakes, RN at [facility].  Please call [phone] ask for Juliann Pulse ?

## 2021-07-19 ENCOUNTER — Telehealth: Payer: Self-pay | Admitting: Family Medicine

## 2021-07-19 NOTE — Telephone Encounter (Signed)
Copied from [place] [id number]. Topic: Medicare AWV ?>> Jul 19, 2021  1:54 PM Harris-Coley, Hannah Beat wrote: ?Reason for CRM: Left message for patient to schedule Annual Wellness Visit.  Please schedule with Nurse Health Advisor Charlott Rakes, RN at [facility].  Please call [phone] ask for Juliann Pulse ?

## 2021-11-13 ENCOUNTER — Other Ambulatory Visit: Payer: Self-pay

## 2021-11-13 ENCOUNTER — Encounter: Payer: Self-pay | Admitting: Family Medicine

## 2021-11-13 DIAGNOSIS — E039 Hypothyroidism, unspecified: Secondary | ICD-10-CM

## 2021-11-13 DIAGNOSIS — Z125 Encounter for screening for malignant neoplasm of prostate: Secondary | ICD-10-CM

## 2021-11-13 DIAGNOSIS — E785 Hyperlipidemia, unspecified: Secondary | ICD-10-CM

## 2021-11-13 DIAGNOSIS — I10 Essential (primary) hypertension: Secondary | ICD-10-CM

## 2021-11-13 DIAGNOSIS — Z0000 Encounter for general adult medical examination without abnormal findings: Secondary | ICD-10-CM

## 2021-11-27 ENCOUNTER — Encounter: Payer: Self-pay | Admitting: *Deleted

## 2021-12-06 ENCOUNTER — Other Ambulatory Visit: Payer: Self-pay | Admitting: Otolaryngology

## 2021-12-06 DIAGNOSIS — D333 Benign neoplasm of cranial nerves: Secondary | ICD-10-CM

## 2021-12-07 ENCOUNTER — Other Ambulatory Visit (INDEPENDENT_AMBULATORY_CARE_PROVIDER_SITE_OTHER): Payer: Medicare Other

## 2021-12-07 DIAGNOSIS — E039 Hypothyroidism, unspecified: Secondary | ICD-10-CM | POA: Diagnosis not present

## 2021-12-07 DIAGNOSIS — I10 Essential (primary) hypertension: Secondary | ICD-10-CM

## 2021-12-07 DIAGNOSIS — Z0000 Encounter for general adult medical examination without abnormal findings: Secondary | ICD-10-CM

## 2021-12-07 DIAGNOSIS — Z125 Encounter for screening for malignant neoplasm of prostate: Secondary | ICD-10-CM | POA: Diagnosis not present

## 2021-12-07 DIAGNOSIS — E785 Hyperlipidemia, unspecified: Secondary | ICD-10-CM

## 2021-12-07 DIAGNOSIS — Z23 Encounter for immunization: Secondary | ICD-10-CM | POA: Diagnosis not present

## 2021-12-07 LAB — COMPREHENSIVE METABOLIC PANEL
ALT: 14 U/L (ref 0–53)
AST: 19 U/L (ref 0–37)
Albumin: 4.2 g/dL (ref 3.5–5.2)
Alkaline Phosphatase: 58 U/L (ref 39–117)
BUN: 13 mg/dL (ref 6–23)
CO2: 30 mEq/L (ref 19–32)
Calcium: 8.6 mg/dL (ref 8.4–10.5)
Chloride: 102 mEq/L (ref 96–112)
Creatinine, Ser: 1.09 mg/dL (ref 0.40–1.50)
GFR: 68.56 mL/min (ref >60.00)
Glucose, Bld: 108 mg/dL — ABNORMAL HIGH (ref 70–99)
Potassium: 3.8 mEq/L (ref 3.5–5.1)
Sodium: 138 mEq/L (ref 135–145)
Total Bilirubin: 0.7 mg/dL (ref 0.2–1.2)
Total Protein: 6.5 g/dL (ref 6.0–8.3)

## 2021-12-07 LAB — LIPID PANEL
Cholesterol: 157 mg/dL (ref 0–200)
HDL: 71.9 mg/dL (ref >39.00)
LDL Cholesterol: 71 mg/dL (ref 0–99)
NonHDL: 85.25
Total CHOL/HDL Ratio: 2
Triglycerides: 71 mg/dL (ref 0.0–149.0)
VLDL: 14.2 mg/dL (ref 0.0–40.0)

## 2021-12-07 LAB — CBC WITH DIFFERENTIAL/PLATELET
Basophils Absolute: 0 10*3/uL (ref 0.0–0.1)
Basophils Relative: 0.5 % (ref 0.0–3.0)
Eosinophils Absolute: 0.2 10*3/uL (ref 0.0–0.7)
Eosinophils Relative: 2.8 % (ref 0.0–5.0)
HCT: 39.9 % (ref 39.0–52.0)
Hemoglobin: 13.6 g/dL (ref 13.0–17.0)
Lymphocytes Relative: 48.3 % — ABNORMAL HIGH (ref 12.0–46.0)
Lymphs Abs: 2.9 10*3/uL (ref 0.7–4.0)
MCHC: 34.1 g/dL (ref 30.0–36.0)
MCV: 94.8 fl (ref 78.0–100.0)
Monocytes Absolute: 0.4 10*3/uL (ref 0.1–1.0)
Monocytes Relative: 6.7 % (ref 3.0–12.0)
Neutro Abs: 2.5 10*3/uL (ref 1.4–7.7)
Neutrophils Relative %: 41.7 % — ABNORMAL LOW (ref 43.0–77.0)
Platelets: 249 10*3/uL (ref 150.0–400.0)
RBC: 4.21 Mil/uL — ABNORMAL LOW (ref 4.22–5.81)
RDW: 13.1 % (ref 11.5–15.5)
WBC: 6 10*3/uL (ref 4.0–10.5)

## 2021-12-07 LAB — TSH: TSH: 2.11 u[IU]/mL (ref 0.35–5.50)

## 2021-12-07 LAB — PSA: PSA: 1 ng/mL (ref 0.10–4.00)

## 2021-12-13 ENCOUNTER — Encounter: Payer: Self-pay | Admitting: Family Medicine

## 2021-12-13 ENCOUNTER — Other Ambulatory Visit: Payer: Self-pay

## 2021-12-13 ENCOUNTER — Ambulatory Visit (INDEPENDENT_AMBULATORY_CARE_PROVIDER_SITE_OTHER): Payer: Medicare Other | Admitting: Family Medicine

## 2021-12-13 VITALS — BP 128/76 | HR 74 | Temp 98.7°F | Ht 68.0 in | Wt 174.8 lb

## 2021-12-13 DIAGNOSIS — H8109 Meniere's disease, unspecified ear: Secondary | ICD-10-CM | POA: Insufficient documentation

## 2021-12-13 DIAGNOSIS — H8102 Meniere's disease, left ear: Secondary | ICD-10-CM | POA: Diagnosis not present

## 2021-12-13 DIAGNOSIS — E785 Hyperlipidemia, unspecified: Secondary | ICD-10-CM | POA: Diagnosis not present

## 2021-12-13 DIAGNOSIS — Z0000 Encounter for general adult medical examination without abnormal findings: Secondary | ICD-10-CM | POA: Diagnosis not present

## 2021-12-13 DIAGNOSIS — I10 Essential (primary) hypertension: Secondary | ICD-10-CM

## 2021-12-13 DIAGNOSIS — E039 Hypothyroidism, unspecified: Secondary | ICD-10-CM

## 2021-12-13 NOTE — Patient Instructions (Addendum)
Glad you are doing well! Great job on weight loss and exercise  Recommended follow up: Return in about 1 year (around 12/14/2022) for physical or sooner if needed.Schedule b4 you leave.

## 2021-12-13 NOTE — Progress Notes (Signed)
Phone: [phone]   Subjective:  Patient presents today for their annual physical. Chief complaint-noted.   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 No recent ocular migraines  The following were reviewed and entered/updated in epic: Past Medical History:  Diagnosis Date   Bilateral cataracts 09/19/2017   Colon polyps    1990s   Diverticulosis of sigmoid colon    noted on 06/2010 colonoscopy.   History of skin cancer    skin   HSV infection    acyclovir for cold sores   Hyperlipidemia    Hypertension    Hypothyroidism    Patient Active Problem List   Diagnosis Date Noted   Meniere disease 12/13/2021    Priority: Medium    Ocular migraine 06/01/2019    Priority: Medium    Hyperglycemia 12/04/2018    Priority: Medium    HSV infection     Priority: Medium    Hyperlipemia 04/04/2007    Priority: Medium    Hypothyroidism 12/06/2006    Priority: Medium    Essential hypertension 12/06/2006    Priority: Medium    SKIN CANCER, HX OF 12/06/2006    Priority: Medium    Dupuytren contracture 01/22/2018    Priority: Low   HIP PAIN, BILATERAL 04/04/2007    Priority: Low   History of colonic polyps 12/06/2006    Priority: Low   Past Surgical History:  Procedure Laterality Date   ACHILLES TENDON REPAIR     ANTERIOR CRUCIATE LIGAMENT REPAIR     right, 09   BASAL CELL CARCINOMA EXCISION     05/2010   CATARACT EXTRACTION Bilateral 2019   COLONOSCOPY     Dupuytren's Contracture Bilateral    INGUINAL HERNIA REPAIR Left 07/01/2019   Procedure: LEFT INGUINAL HERNIA WITH MESH;  Surgeon: Erroll Luna, MD;  Location: [facility];  Service: General;  Laterality: Left;  GENERAL AND TAP BLOCK   POLYPECTOMY     in 1990s, adenomatous colon polyp   REPAIR OF PERONEUS BREVIS TENDON Left 12/29/2020   Procedure: LEFT ANKLE PERONEUS BREVIS AND LONGUS TENDON  RECONSTRUCTION;  Surgeon: Wylene Simmer, MD;  Location: [facility];  Service: Orthopedics;  Laterality: Left;   SHOULDER ARTHROSCOPY Left 02/09/2019    Family History  Problem Relation Age of Onset   Colon cancer Paternal Grandfather    Hypertension Mother        died at 88   Stroke Maternal Grandfather    Heart disease Father        heart attack 40, smoking   Lung cancer Father        2 years after heart attack   Other Daughter        accidental death- high level delsym ingredient   Esophageal cancer Neg Hx    Rectal cancer Neg Hx    Stomach cancer Neg Hx     Medications- reviewed and updated Current Outpatient Medications  Medication Sig Dispense Refill   acyclovir (ZOVIRAX) 400 MG tablet Take 1 tablet (400 mg total) by mouth 2 (two) times daily as needed (cold sores). 180 tablet 3   aspirin EC 81 MG tablet Take 1 tablet (81 mg total) by mouth 2 (two) times daily. 28 tablet 0   azelastine (ASTELIN) 0.1 % nasal spray Place 2 sprays into both nostrils 2 (two) times daily. Use in each nostril as directed 90 mL 3  Biotin 300 MCG TABS Take by mouth 2 (two) times daily.     co-enzyme Q-10 30 MG capsule Take 30 mg by mouth daily.     diphenhydrAMINE (BENADRYL) 25 mg capsule      fish oil-omega-3 fatty acids 1000 MG capsule Take 1 g by mouth daily.     glucosamine-chondroitin 500-400 MG tablet Take 2 tablets by mouth daily.     levothyroxine (SYNTHROID) 100 MCG tablet Take 1 tablet (100 mcg total) by mouth daily. 100 tablet 4   losartan (COZAAR) 50 MG tablet Take 1 tablet (50 mg total) by mouth daily. 100 tablet 4   simvastatin (ZOCOR) 20 MG tablet Take 1 tablet (20 mg total) by mouth every evening. 100 tablet 4   docusate sodium (COLACE) 100 MG capsule Take 1 capsule (100 mg total) by mouth 2 (two) times daily. While taking narcotic pain medicine. 30 capsule 0   senna (SENOKOT) 8.6 MG TABS tablet Take 2 tablets (17.2 mg total) by mouth 2 (two) times daily. 30 tablet 0    No current facility-administered medications for this visit.    Allergies-reviewed and updated No Known Allergies  Social History   Social History Narrative   Married since 1974. Lost oldest daughter in 2008. 2 living children- 2 grandkids both in town- they keep the youngest 71 years old in 2019.       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 Held back some- because had to help wife after rotator cuff surgery.    Objective  Objective:  BP 128/76   Pulse 74   Temp 98.7 F (37.1 C)   Ht '5\' 8"'$  (1.727 m)   Wt 174 lb 12.8 oz (79.3 kg)   SpO2 99%   BMI 26.58 kg/m  Gen: NAD, resting comfortably HEENT: Mucous membranes are moist. Oropharynx normal. TM normal Neck: no thyromegaly CV: RRR no murmurs rubs or gallops Lungs: CTAB no crackles, wheeze, rhonchi Abdomen: soft/nontender/nondistended/normal bowel sounds. No rebound or guarding.  Ext: no edema Skin: warm, dry Neuro: grossly normal, moves all extremities, PERRLA    Assessment and Plan  71 y.o. male presenting for annual physical.  Health Maintenance counseling: 1. Anticipatory guidance: Patient counseled regarding regular dental exams -q6 months- on invisalign- finishes in november, eye exams - sees Dr. Satira Sark and Dr. Yolanda Bonine (sept 2022)- typically yearly for each with history retinal tear and laser,  avoiding smoking and second hand smoke , limiting alcohol to 2 beverages per day - occasional wine, no illicit drugs .   2. Risk factor reduction:  Advised patient of need for regular exercise and diet rich and fruits and vegetables to reduce risk of heart attack and stroke.  Exercise- 5 days a week some weeks - usually at least 4.  Diet/weight management-Down 15 pounds in the last year- between invasalign cutting snacking, cutting sodium for meniere's.  Wt Readings from Last 3 Encounters:  12/13/21 174 lb 12.8 oz  (79.3 kg)  12/29/20 188 lb 0.8 oz (85.3 kg)  12/08/20 189 lb 12.8 oz (86.1 kg)  3. Immunizations/screenings/ancillary studies- holding off on covid, had flu shot last week, rsv- opts out  Immunization History  Administered Date(s) Administered   Fluad Quad(high Dose 65+) 11/26/2018, 12/08/2019, 12/08/2020, 12/07/2021   Influenza Whole 12/03/2008   Influenza, High Dose Seasonal PF 11/26/2016, 12/02/2017   Influenza,inj,Quad PF,6+ Mos 11/22/2015   Influenza,inj,quad, With Preservative  12/18/2016   PFIZER(Purple Top)SARS-COV-2 Vaccination 04/09/2019, 05/05/2019   Pneumococcal Conjugate-13 11/26/2016   Pneumococcal Polysaccharide-23 12/02/2017   Td 04/06/2008   Tdap 09/01/2018   Zoster Recombinat (Shingrix) 07/16/2017, 09/16/2017   Zoster, Live 05/31/2011  4. Prostate cancer screening- low risk psa trend - continue current meds  Lab Results  Component Value Date   PSA 1.00 12/07/2021   PSA 1.03 12/05/2020   PSA 0.83 12/04/2019  5. Colon cancer screening - last done 04/07/2020 with a 7-year repeat planned 6. Skin cancer screening- has seen dermatology in the past but not recently- has had skin cancer in past. advised regular sunscreen use. Denies worrisome, changing, or new skin lesions- other than one spoton left chest that is stable but tends to change color with sun exposure 7. Smoking associated screening (lung cancer screening, AAA screen 65-75, UA)- Never smoker 8. STD screening - opts out as mongamous  Status of chronic or acute concerns   #hypertension S: medication: Losartan 50 mg BP Readings from Last 3 Encounters:  12/13/21 128/76  12/29/20 114/73  12/08/20 108/60  A/P: Controlled. Continue current medications.    #hyperlipidemia S: Medication:Simvastatin 20 mg, aspirin for primary prevention Lab Results  Component Value Date   CHOL 157 12/07/2021   HDL 71.90 12/07/2021   LDLCALC 71 12/07/2021   TRIG 71.0 12/07/2021   CHOLHDL 2 12/07/2021   A/P:  very close to  ideal control- continue current meds for primary prevention  #hypothyroidism S: compliant On thyroid medication-levothyroxine 100 mcg Lab Results  Component Value Date   TSH 2.11 12/07/2021  A/P: Controlled. Continue current medications.    # Hyperglycemia-fasting sugars low 100s but last A1c 2016 5.2 S:  Medication: none Lab Results  Component Value Date   HGBA1C 5.2 07/22/2014   HGBA1C 5.3 06/02/2013   HGBA1C 5.1 03/26/2006  A/P: CBGs slightly high at 108 on this year's labs-offered point-of-care A1c   #Cold sores-acyclovir as needed/sparing use   #Ocular migraine-diagnosed by ophthalmology March 2021 severe floater x2 at that time-no more ocular migraines   #Meniere's - following with Dr. Benjamine Mola- visit oct 2023- reports stable hearing loss -has repeat MRI scheduled 12/16/21 after 03/10/21 enhancement noted- possible schwannoma (on opposite)  Recommended follow up: Return in about 1 year (around 12/14/2022) for physical or sooner if needed.Schedule b4 you leave. Future Appointments  Date Time Provider [place]  12/16/2021  2:20 PM GI-315 MR 2 GI-315MRI [facility]   Lab/Order associations: already had labs   ICD-10-CM   1. Preventative health care  Z00.00     2. Meniere's disease of left ear  H81.02     3. Hypothyroidism, unspecified type  E03.9     4. Hyperlipidemia, unspecified hyperlipidemia type  E78.5     5. Essential hypertension  I10       No orders of the defined types were placed in this encounter.   Return precautions advised.  Garret Reddish, MD

## 2021-12-16 ENCOUNTER — Ambulatory Visit
Admission: RE | Admit: 2021-12-16 | Discharge: 2021-12-16 | Disposition: A | Discharge status: Home / Self Care | Payer: Medicare Other | Source: Ambulatory Visit | Attending: Otolaryngology | Admitting: Otolaryngology

## 2021-12-16 DIAGNOSIS — D333 Benign neoplasm of cranial nerves: Secondary | ICD-10-CM

## 2021-12-16 MED ORDER — GADOPICLENOL 0.5 MMOL/ML IV SOLN
8.0000 mL | Freq: Once | INTRAVENOUS | Status: AC | PRN
  Administered 2021-12-16: 8 mL via INTRAVENOUS

## 2022-01-11 ENCOUNTER — Other Ambulatory Visit: Payer: Self-pay | Admitting: Family Medicine

## 2022-02-08 ENCOUNTER — Telehealth: Payer: Self-pay | Admitting: Family Medicine

## 2022-02-08 NOTE — Telephone Encounter (Signed)
Copied from [place] [id number]. Topic: Medicare AWV >> Feb 08, 2022 10:54 AM Gillis Santa wrote: Reason for CRM: LVM PATIENT TO CALL [phone] SCHEDULE AWVS [place]

## 2022-02-13 ENCOUNTER — Other Ambulatory Visit: Payer: Self-pay | Admitting: Family Medicine

## 2022-03-08 ENCOUNTER — Telehealth: Payer: Self-pay | Admitting: Family Medicine

## 2022-03-08 NOTE — Telephone Encounter (Signed)
Copied from [place] [id number]. Topic: Medicare AWV >> Mar 08, 2022 12:30 PM Gillis Santa wrote: Reason for CRM: LVM PATIENT TO CALL [phone] TO SCHEDULE AWVS [place]

## 2022-04-19 IMAGING — MR MR HEAD WO/W CM
13 of 14 series · 44 of 48 positions shown · IV contrast (multihance)
Comparison: None.

CLINICAL DATA: Vertigo, muffled hearing in both ears for several
months

EXAM:
MRI HEAD WITHOUT AND WITH CONTRAST
TECHNIQUE: Multiplanar, multiecho pulse sequences of the brain and surrounding
structures were obtained without and with intravenous contrast.
CONTRAST:  17mL MULTIHANCE GADOBENATE DIMEGLUMINE 529 MG/ML IV SOLN

[Series 5: T1 · sagittal · 4.0mm · 0.72mm/px · 1 of 27 slices shown (1 of 3)]
[im 1/27]
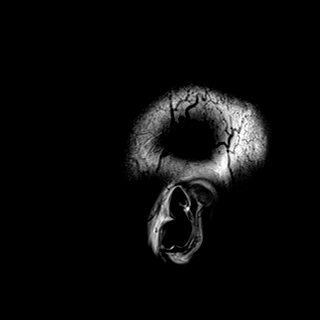

[Series 6: DWI · axial · 3.0mm · 0.94mm/px · z∈[-37,+107]mm · 10 of 168 slices shown]
[im 1/168]
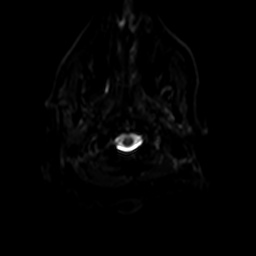
[im 19/168]
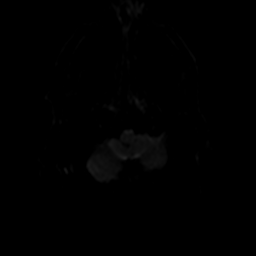
[im 38/168]
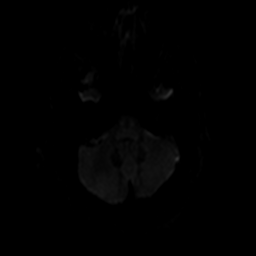
[im 56/168]
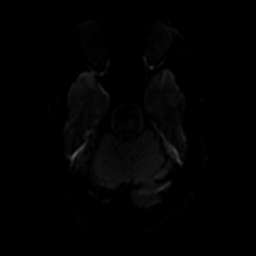
[im 75/168]
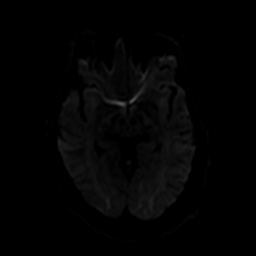
[im 93/168]
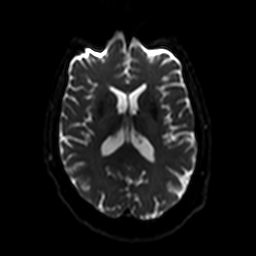
[im 112/168]
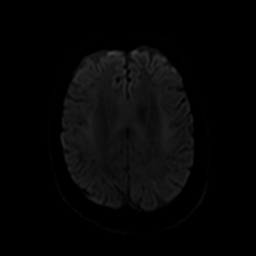
[im 130/168]
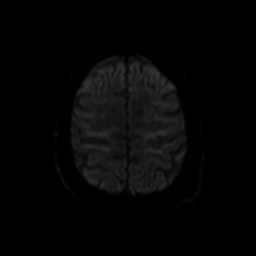
[im 149/168]
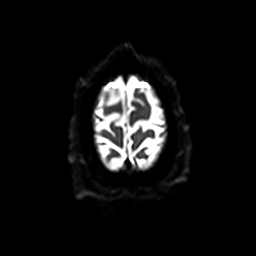
[im 168/168]
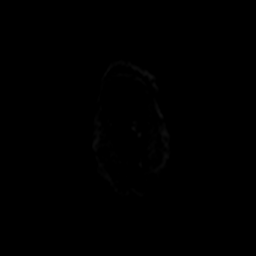

[Series 7: ax dwi_tracew · axial · 3.0mm · 0.94mm/px · z∈[-37,+107]mm · 5 of 84 slices shown]
[im 1/84]
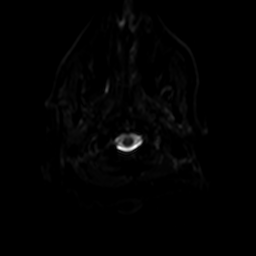
[im 21/84]
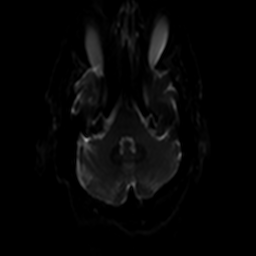
[im 42/84]
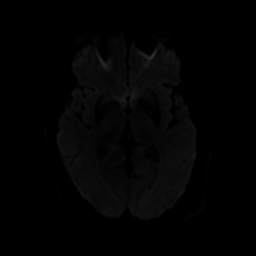
[im 63/84]
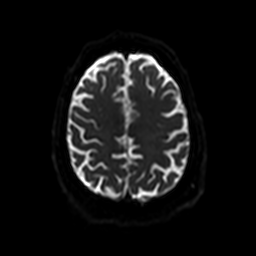
[im 84/84]
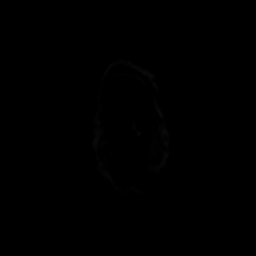

[Series 8: ax dwi_adc · axial · 3.0mm · 0.94mm/px · z∈[-37,+107]mm · 3 of 42 slices shown]
[im 1/42]
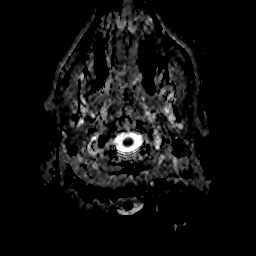
[im 21/42]
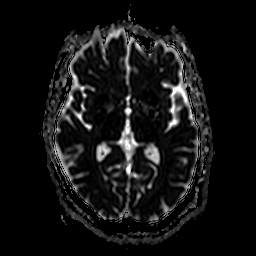
[im 42/42]
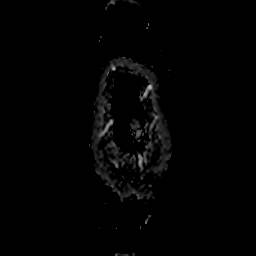

[Series 9: T2 · axial · 4.0mm · 0.36mm/px · z∈[-35,+112]mm · 2 of 30 slices shown (1 of 2)]
[im 1/30]
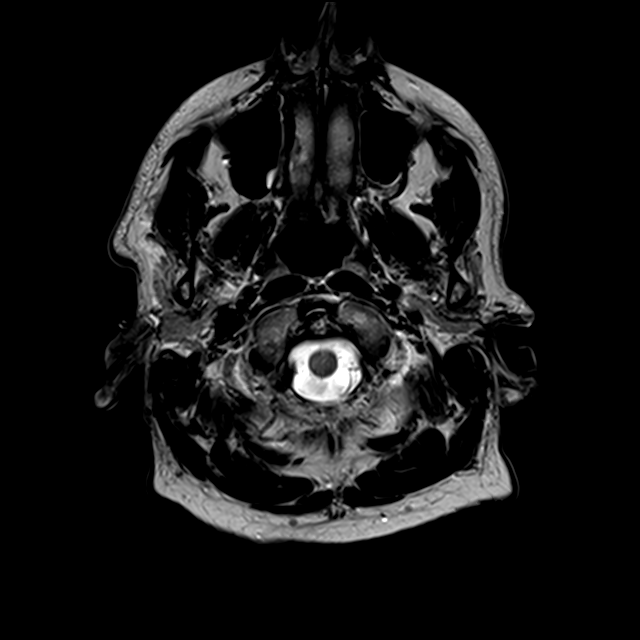
[im 30/30]
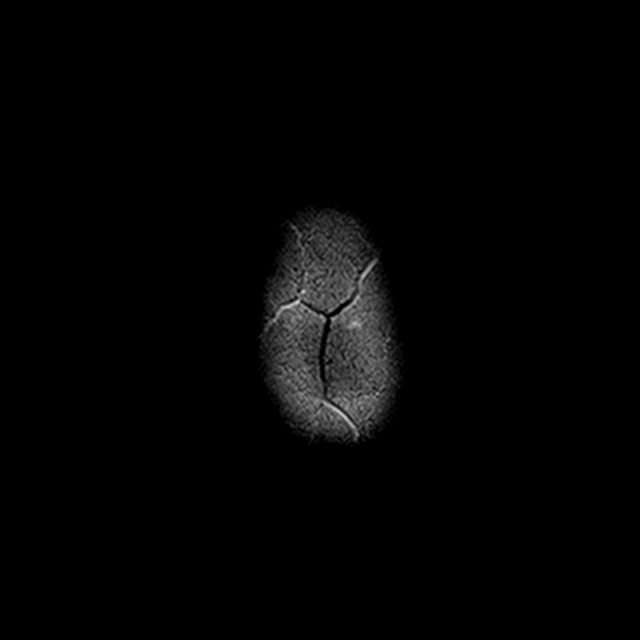

[Series 10: FLAIR · axial · 3.0mm · 0.72mm/px · z∈[-40,+117]mm · 2 of 28 slices shown]
[im 1/28]
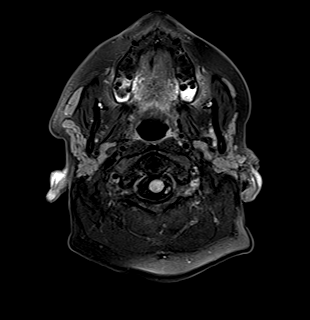
[im 28/28]
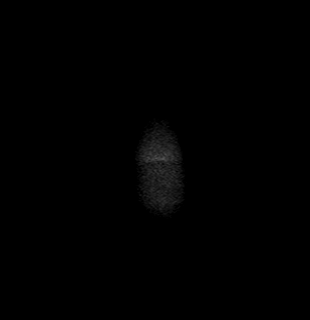

[Series 11: swi_images · axial · 1.5mm · 0.90mm/px · z∈[-40,+110]mm · 6 of 104 slices shown]
[im 1/104]
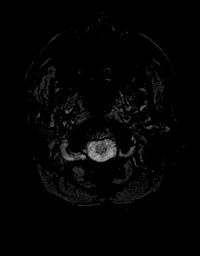
[im 21/104]
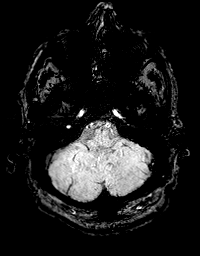
[im 42/104]
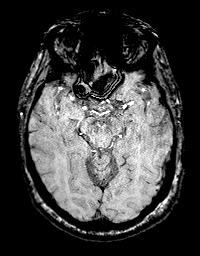
[im 62/104]
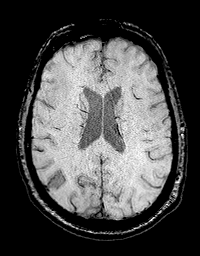
[im 83/104]
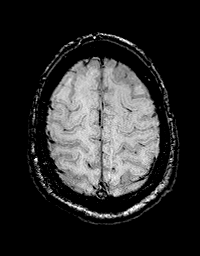
[im 104/104]
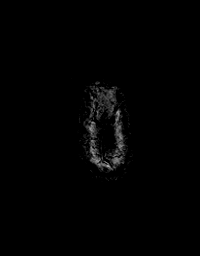

[Series 13: T1 · coronal · 3.0mm · 0.56mm/px · 1 of 13 slices shown (2 of 3)]
[im 1/13]
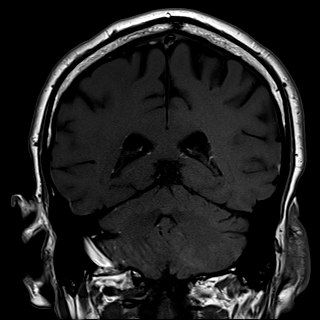

[Series 14: T2 · coronal · 2.0mm · 0.56mm/px · 1 of 13 slices shown (2 of 2)]
[im 1/13]
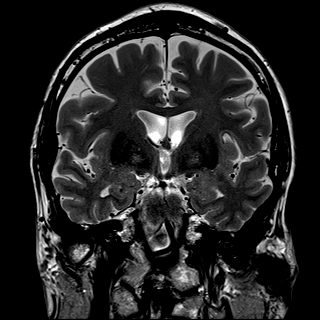

[Series 15: T1 · axial · 3.0mm · 0.50mm/px · 1 of 13 slices shown (3 of 3)]
[im 1/13]
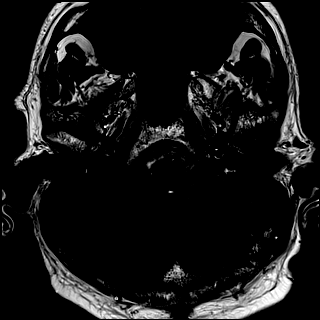

[Series 17: T1 post-contrast · coronal · 3.0mm · 0.56mm/px · 1 of 13 slices shown (1 of 3)]
[im 1/13]
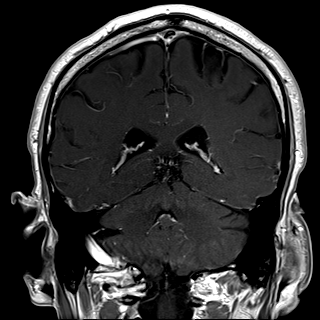

[Series 18: T1 post-contrast · axial · 3.0mm · 0.50mm/px · 1 of 13 slices shown (2 of 3)]
[im 1/13]
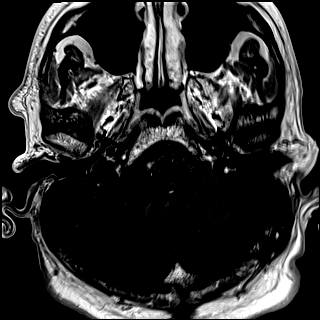

[Series 19: T1 post-contrast · axial · 1.0mm · 0.90mm/px · z∈[-40,+115]mm · 10 of 160 slices shown (3 of 3)]
[im 1/160]
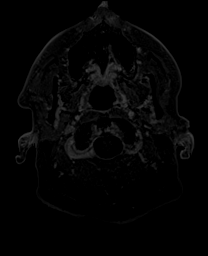
[im 18/160]
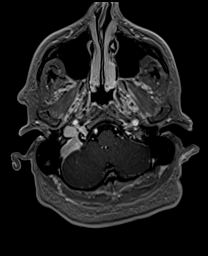
[im 36/160]
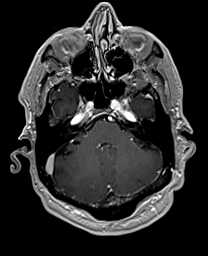
[im 54/160]
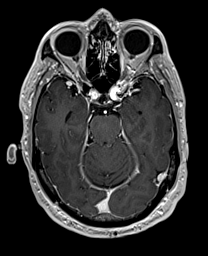
[im 71/160]
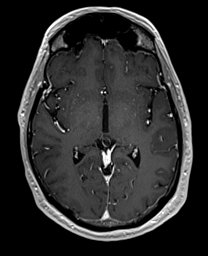
[im 89/160]
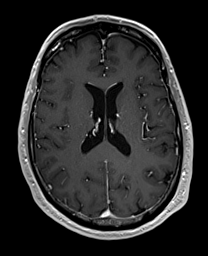
[im 107/160]
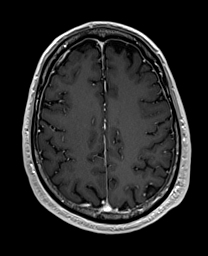
[im 124/160]
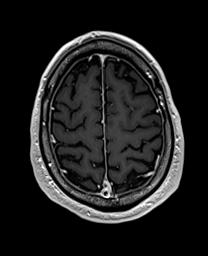
[im 142/160]
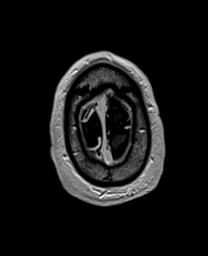
[im 160/160]
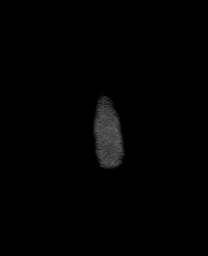

[44 of 48 positions shown; findings below may reference images not displayed]

FINDINGS: Brain: There is no evidence of acute intracranial hemorrhage,
extra-axial fluid collection, or acute infarct.

Parenchymal volume is normal. The ventricles are normal in size.
There is no parenchymal signal abnormality.

There is no abnormal enhancement. There is no mass lesion. There is
no midline shift.

Internal auditory canals: There is a small focus of enhancement in
the right IAC on the thin slice Sirouven Mantadin images which is not present
on the spin echo images (19-37) which could reflect a small
schwannoma versus enhancing blood vessel given that the ICA courses
into the IAC. No other enhancing mass is seen. The cochleae and
semicircular canals are normal. Normal porus acusticus and
vestibular aqueduct bilaterally.

Vascular: Normal flow voids. The left transverse and sigmoid sinuses
are hypoplastic, likely a developmental variant.

Skull and upper cervical spine: Normal marrow signal.

Sinuses/Orbits: The paranasal sinuses are clear. Bilateral lens
implants are in place. The globes and orbits are otherwise
unremarkable.

Other: None.
IMPRESSION: 1. Small focus of enhancement in the right IAC may reflect a small
schwannoma or possibly an enhancing blood vessel given that the
right SCA courses into the IAC. Consider follow-up imaging to see if
this lesion persists.
2. Otherwise, appearance of the brain and IAC's.

## 2022-05-17 ENCOUNTER — Telehealth: Payer: Self-pay | Admitting: Family Medicine

## 2022-05-17 NOTE — Telephone Encounter (Signed)
Copied from [place]. Topic: Medicare AWV >> May 17, 2022  1:40 PM Gillis Santa wrote: Reason for CRM: Called patient to schedule Medicare Annual Wellness Visit (AWV). Left message for patient to call back and schedule Medicare Annual Wellness Visit (AWV).  Last date of AWV: 01/01/2020  Please schedule an appointment at any time with Otila Kluver, [facility]. Please schedule AWVS with health coach, [place].  If any questions, please contact me at [phone].  Thank you ,  Shaune Pollack [facility] AWV TEAM Direct Dial [phone]

## 2022-06-06 ENCOUNTER — Telehealth: Payer: Self-pay | Admitting: Family Medicine

## 2022-06-06 NOTE — Telephone Encounter (Signed)
Copied from [place] [id number]. Topic: Medicare AWV >> Jun 06, 2022 11:00 AM Gillis Santa wrote: Reason for CRM: Called patient to schedule Medicare Annual Wellness Visit (AWV). Left message for patient to call back and schedule Medicare Annual Wellness Visit (AWV).  Last date of AWV: 01/01/2020  Please schedule an appointment at any time with Otila Kluver, [facility]. Please schedule AWVS with Otila Kluver, [place]..  If any questions, please contact me at [phone].  Thank you ,  Shaune Pollack [facility] AWV TEAM Direct Dial [phone]

## 2022-07-06 ENCOUNTER — Telehealth: Payer: Self-pay | Admitting: Family Medicine

## 2022-07-06 NOTE — Telephone Encounter (Signed)
Copied from CRM [id number]. Topic: Medicare AWV >> Jul 06, 2022 10:13 AM Gwenith Spitz wrote: Reason for CRM: Called patient to schedule Medicare Annual Wellness Visit (AWV). Left message for patient to call back and schedule Medicare Annual Wellness Visit (AWV).  Last date of AWV: 01/01/2020  Please schedule an appointment at any time with Inetta Fermo, [facility]. Please schedule AWVS with Inetta Fermo, [facility].  If any questions, please contact me at [phone].  Thank you ,  Gabriel Cirri [facility] AWV TEAM Direct Dial [phone]

## 2022-08-23 NOTE — Telephone Encounter (Signed)
Called back to speak with Clydie Braun. Asking for a call back.

## 2022-12-17 ENCOUNTER — Encounter: Payer: Medicare Other | Admitting: Family Medicine

## 2023-01-12 ENCOUNTER — Other Ambulatory Visit: Payer: Self-pay | Admitting: Family Medicine

## 2023-01-18 ENCOUNTER — Other Ambulatory Visit: Payer: Self-pay | Admitting: Family Medicine

## 2023-04-04 ENCOUNTER — Encounter: Payer: Self-pay | Admitting: Family Medicine

## 2023-04-05 ENCOUNTER — Other Ambulatory Visit: Payer: Self-pay

## 2023-04-05 DIAGNOSIS — E039 Hypothyroidism, unspecified: Secondary | ICD-10-CM

## 2023-04-05 DIAGNOSIS — E785 Hyperlipidemia, unspecified: Secondary | ICD-10-CM

## 2023-04-05 DIAGNOSIS — Z125 Encounter for screening for malignant neoplasm of prostate: Secondary | ICD-10-CM

## 2023-04-05 DIAGNOSIS — Z0000 Encounter for general adult medical examination without abnormal findings: Secondary | ICD-10-CM

## 2023-04-05 DIAGNOSIS — I10 Essential (primary) hypertension: Secondary | ICD-10-CM

## 2023-04-09 ENCOUNTER — Other Ambulatory Visit: Payer: Medicare Other

## 2023-04-09 ENCOUNTER — Encounter: Payer: Self-pay | Admitting: Family Medicine

## 2023-04-09 DIAGNOSIS — Z125 Encounter for screening for malignant neoplasm of prostate: Secondary | ICD-10-CM

## 2023-04-09 DIAGNOSIS — I10 Essential (primary) hypertension: Secondary | ICD-10-CM

## 2023-04-09 DIAGNOSIS — E785 Hyperlipidemia, unspecified: Secondary | ICD-10-CM | POA: Diagnosis not present

## 2023-04-09 DIAGNOSIS — E039 Hypothyroidism, unspecified: Secondary | ICD-10-CM

## 2023-04-09 DIAGNOSIS — Z0000 Encounter for general adult medical examination without abnormal findings: Secondary | ICD-10-CM | POA: Diagnosis not present

## 2023-04-09 LAB — COMPREHENSIVE METABOLIC PANEL
ALT: 11 U/L (ref 0–53)
AST: 16 U/L (ref 0–37)
Albumin: 4.1 g/dL (ref 3.5–5.2)
Alkaline Phosphatase: 60 U/L (ref 39–117)
BUN: 14 mg/dL (ref 6–23)
CO2: 28 meq/L (ref 19–32)
Calcium: 8.5 mg/dL (ref 8.4–10.5)
Chloride: 103 meq/L (ref 96–112)
Creatinine, Ser: 1.04 mg/dL (ref 0.40–1.50)
GFR: 71.86 mL/min (ref >60.00)
Glucose, Bld: 110 mg/dL — ABNORMAL HIGH (ref 70–99)
Potassium: 4.3 meq/L (ref 3.5–5.1)
Sodium: 139 meq/L (ref 135–145)
Total Bilirubin: 0.8 mg/dL (ref 0.2–1.2)
Total Protein: 6.3 g/dL (ref 6.0–8.3)

## 2023-04-09 LAB — LIPID PANEL
Cholesterol: 146 mg/dL (ref 0–200)
HDL: 65.2 mg/dL (ref >39.00)
LDL Cholesterol: 58 mg/dL (ref 0–99)
NonHDL: 80.76
Total CHOL/HDL Ratio: 2
Triglycerides: 112 mg/dL (ref 0.0–149.0)
VLDL: 22.4 mg/dL (ref 0.0–40.0)

## 2023-04-09 LAB — CBC WITH DIFFERENTIAL/PLATELET
Basophils Absolute: 0 10*3/uL (ref 0.0–0.1)
Basophils Relative: 0.6 % (ref 0.0–3.0)
Eosinophils Absolute: 0.1 10*3/uL (ref 0.0–0.7)
Eosinophils Relative: 2.1 % (ref 0.0–5.0)
HCT: 41.1 % (ref 39.0–52.0)
Hemoglobin: 14.1 g/dL (ref 13.0–17.0)
Lymphocytes Relative: 46.2 % — ABNORMAL HIGH (ref 12.0–46.0)
Lymphs Abs: 2.3 10*3/uL (ref 0.7–4.0)
MCHC: 34.2 g/dL (ref 30.0–36.0)
MCV: 96.8 fL (ref 78.0–100.0)
Monocytes Absolute: 0.3 10*3/uL (ref 0.1–1.0)
Monocytes Relative: 6 % (ref 3.0–12.0)
Neutro Abs: 2.2 10*3/uL (ref 1.4–7.7)
Neutrophils Relative %: 45.1 % (ref 43.0–77.0)
Platelets: 241 10*3/uL (ref 150.0–400.0)
RBC: 4.25 Mil/uL (ref 4.22–5.81)
RDW: 12.9 % (ref 11.5–15.5)
WBC: 4.9 10*3/uL (ref 4.0–10.5)

## 2023-04-09 LAB — PSA: PSA: 1.92 ng/mL (ref 0.10–4.00)

## 2023-04-09 LAB — TSH: TSH: 4.28 u[IU]/mL (ref 0.35–5.50)

## 2023-04-17 ENCOUNTER — Other Ambulatory Visit: Payer: Self-pay | Admitting: Family Medicine

## 2023-04-17 ENCOUNTER — Encounter: Payer: Self-pay | Admitting: Family Medicine

## 2023-04-17 ENCOUNTER — Ambulatory Visit (INDEPENDENT_AMBULATORY_CARE_PROVIDER_SITE_OTHER): Payer: Medicare Other | Admitting: Family Medicine

## 2023-04-17 VITALS — BP 136/76 | HR 69 | Temp 97.9°F | Ht 68.0 in | Wt 169.0 lb

## 2023-04-17 DIAGNOSIS — E039 Hypothyroidism, unspecified: Secondary | ICD-10-CM | POA: Diagnosis not present

## 2023-04-17 DIAGNOSIS — I10 Essential (primary) hypertension: Secondary | ICD-10-CM

## 2023-04-17 DIAGNOSIS — Z125 Encounter for screening for malignant neoplasm of prostate: Secondary | ICD-10-CM

## 2023-04-17 DIAGNOSIS — Z0000 Encounter for general adult medical examination without abnormal findings: Secondary | ICD-10-CM

## 2023-04-17 DIAGNOSIS — Z131 Encounter for screening for diabetes mellitus: Secondary | ICD-10-CM

## 2023-04-17 DIAGNOSIS — E785 Hyperlipidemia, unspecified: Secondary | ICD-10-CM | POA: Diagnosis not present

## 2023-04-17 DIAGNOSIS — R739 Hyperglycemia, unspecified: Secondary | ICD-10-CM

## 2023-04-17 MED ORDER — MECLIZINE HCL 25 MG PO TABS: 25.0000 mg | ORAL_TABLET | Freq: Three times a day (TID) | ORAL | 1 refills | Status: AC | PRN

## 2023-04-17 NOTE — Patient Instructions (Addendum)
You are eligible to schedule your annual wellness visit with our nurse specialist Inetta Fermo.  Please consider scheduling this before you leave today  Schedule labs in about a month at the desk- no sex or vigorous exercise 2 days before test- if PSA goes up more may send to urology. Also checking a1c- 3 month blood sugar check  Recommended follow up: Return in about 1 year (around 04/16/2024) for physical or sooner if needed.Schedule b4 you leave.

## 2023-04-17 NOTE — Progress Notes (Signed)
 Phone: [phone]   Subjective:  Patient presents today for their annual physical. Chief complaint-noted.   See problem oriented charting- ROS- full  review of systems was completed and negative  except for: hearing loss, tinnitus. No recent vertigo  The following were reviewed and entered/updated in epic: Past Medical History:  Diagnosis Date   Bilateral cataracts 09/19/2017   Cancer [facility])    Colon polyps    1990s   Diverticulosis of sigmoid colon    noted on 06/2010 colonoscopy.   History of skin cancer    skin   HSV infection    acyclovir for cold sores   Hyperlipidemia    Hypertension    Hypothyroidism    Patient Active Problem List   Diagnosis Date Noted   Meniere disease 12/13/2021    Priority: Medium    Ocular migraine 06/01/2019    Priority: Medium    Hyperglycemia 12/04/2018    Priority: Medium    HSV infection     Priority: Medium    Hyperlipemia 04/04/2007    Priority: Medium    Hypothyroidism 12/06/2006    Priority: Medium    Essential hypertension 12/06/2006    Priority: Medium    SKIN CANCER, HX OF 12/06/2006    Priority: Medium    Dupuytren contracture 01/22/2018    Priority: Low   HIP PAIN, BILATERAL 04/04/2007    Priority: Low   History of colonic polyps 12/06/2006    Priority: Low   Past Surgical History:  Procedure Laterality Date   ACHILLES TENDON REPAIR     ANTERIOR CRUCIATE LIGAMENT REPAIR     right, 09   BASAL CELL CARCINOMA EXCISION     05/2010   CATARACT EXTRACTION Bilateral 2019   COLONOSCOPY     Dupuytren's Contracture Bilateral    EYE SURGERY  July 18 & August 1   INGUINAL HERNIA REPAIR Left 07/01/2019   Procedure: LEFT INGUINAL HERNIA WITH MESH;  Surgeon: Harriette Bouillon, MD;  Location: [facility];  Service: General;  Laterality: Left;  GENERAL AND TAP BLOCK   POLYPECTOMY     in 1990s, adenomatous colon polyp   REPAIR OF PERONEUS BREVIS TENDON Left 12/29/2020   Procedure: LEFT ANKLE PERONEUS BREVIS AND  LONGUS TENDON RECONSTRUCTION;  Surgeon: Toni Arthurs, MD;  Location: [facility];  Service: Orthopedics;  Laterality: Left;   SHOULDER ARTHROSCOPY Left 02/09/2019    Family History  Problem Relation Age of Onset   Colon cancer Paternal Grandfather    Hypertension Mother        died at 72   Stroke Maternal Grandfather    Heart disease Father        heart attack 39, smoking   Lung cancer Father        2 years after heart attack   Cancer Father    Other Daughter        accidental death- high level delsym ingredient   Esophageal cancer Neg Hx    Rectal cancer Neg Hx    Stomach cancer Neg Hx     Medications- reviewed and updated Current Outpatient Medications  Medication Sig Dispense Refill   acyclovir (ZOVIRAX) 400 MG tablet TAKE 1 TABLET BY MOUTH  TWICE DAILY AS NEEDED FOR  COLD SORES 180 tablet 3   aspirin EC 81 MG tablet Take 1 tablet (81 mg total) by mouth 2 (two) times daily. 28 tablet 0   Azelastine HCl 137 MCG/SPRAY SOLN USE 2 SPRAYS IN BOTH NOSTRILS  TWICE DAILY  AS DIRECTED 120 mL 3   Biotin 300 MCG TABS Take by mouth daily.     co-enzyme Q-10 30 MG capsule Take 30 mg by mouth daily.     diphenhydrAMINE (BENADRYL) 25 mg capsule      glucosamine-chondroitin 500-400 MG tablet Take 1 tablet by mouth daily.     levothyroxine (SYNTHROID) 100 MCG tablet TAKE 1 TABLET BY MOUTH DAILY 90 tablet 3   losartan (COZAAR) 50 MG tablet TAKE 1 TABLET BY MOUTH DAILY 90 tablet 3   meclizine (ANTIVERT) 25 MG tablet Take 1 tablet (25 mg total) by mouth 3 (three) times daily as needed (meniere's related vertigo). 90 tablet 1   simvastatin (ZOCOR) 20 MG tablet TAKE 1 TABLET BY MOUTH IN THE  EVENING 90 tablet 3   tadalafil (CIALIS) 5 MG tablet Take 1 tablet by mouth daily.     fish oil-omega-3 fatty acids 1000 MG capsule Take 1 g by mouth daily.     No current facility-administered medications for this visit.    Allergies-reviewed and updated No Known Allergies  Social History    Social History Narrative   Married since 1974. Lost oldest daughter in 2008. 2 living children- 2 grandkids both in town- they keep the youngest 73 years old in 2019.       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 Held back some- because had to help wife after rotator cuff surgery.    Objective  Objective:  BP 136/76   Pulse 69   Temp 97.9 F (36.6 C)   Ht 5\' 8"  (1.727 m)   Wt 169 lb (76.7 kg)   SpO2 95%   BMI 25.70 kg/m  Gen: NAD, resting comfortably HEENT: Mucous membranes are moist. Oropharynx normal Neck: no thyromegaly CV: RRR no murmurs rubs or gallops Lungs: CTAB no crackles, wheeze, rhonchi Abdomen: soft/nontender/nondistended/normal bowel sounds. No rebound or guarding.  Ext: no edema Skin: warm, dry Neuro: grossly normal, moves all extremities, PERRLA Offered rectal and genitourinary exam- he opts out for now even with mildly elevated psa   Assessment and Plan  73 y.o. male presenting for annual physical.  Health Maintenance counseling: 1. Anticipatory guidance: Patient counseled regarding regular dental exams -q6 months- working on some implant issues, eye exams - Dr. Burgess Estelle and Dr. Lelan Pons with history of retinal tear and laser,  avoiding smoking and second hand smoke , limiting alcohol to 2 beverages per day - rare wine, no illicit drugs .   2. Risk factor reduction:  Advised patient of need for regular exercise and diet rich and fruits and vegetables to reduce risk of heart attack and stroke.  Exercise- excellent 5-6 days a week. May be harder at [facility] - trying to spend more time up there Diet/weight management-down 5 lbs - reasonably healthy weight for age and muscle amss.  Wt Readings from Last 3 Encounters:  04/17/23 169 lb (76.7 kg)  12/13/21 174 lb 12.8 oz (79.3 kg)  12/29/20 188 lb 0.8 oz (85.3 kg)  3.  Immunizations/screenings/ancillary studies- up to date other than holding off on COVID  Immunization History  Administered Date(s) Administered   Fluad Quad(high Dose 65+) 11/26/2018, 12/08/2019, 12/08/2020, 12/07/2021   Hepatitis A, Adult 07/24/2022, 01/24/2023   Influenza Whole 12/03/2008   Influenza, High Dose Seasonal PF 11/26/2016, 12/02/2017, 01/01/2023   Influenza,inj,Quad PF,6+ Mos 11/22/2015   Influenza,inj,quad, With Preservative  12/18/2016   PFIZER(Purple Top)SARS-COV-2 Vaccination 04/09/2019, 05/05/2019   Pneumococcal Conjugate-13 11/26/2016   Pneumococcal Polysaccharide-23 12/02/2017   Td 04/06/2008   Tdap 09/01/2018   Zoster Recombinant(Shingrix) 07/16/2017, 09/16/2017   Zoster, Live 05/31/2011  4. Prostate cancer screening- PSA trend up slightly- recheck in a month to be on safe side  Lab Results  Component Value Date   PSA 1.92 04/09/2023   PSA 1.00 12/07/2021   PSA 1.03 12/05/2020   5. Colon cancer screening - 04/07/20 with 7 year repeat planned 6. Skin cancer screening- [facility] with history skin cancer in past- no recent issues- encouraged follow up. advised regular sunscreen use. Denies worrisome, changing, or new skin lesions.  7. Smoking associated screening (lung cancer screening, AAA screen 65-75, UA)- never smoker 8. STD screening - only active with wife  Status of chronic or acute concerns   #hypertension S: medication: Losartan 50 mg - #s at home usually around 120 BP Readings from Last 3 Encounters:  04/17/23 136/76  12/13/21 128/76  12/29/20 114/73  A/P: high acceptable- continue current medications    #hyperlipidemia S: Medication:Simvastatin 20 mg,  prefers aspirin for primary prevention Lab Results  Component Value Date   CHOL 146 04/09/2023   HDL 65.20 04/09/2023   LDLCALC 58 04/09/2023   TRIG 112.0 04/09/2023   CHOLHDL 2 04/09/2023   A/P:  lipids look excellent- continue current medications   #hypothyroidism S: compliant On  thyroid medication-levothyroxine 100 mcg Lab Results  Component Value Date   TSH 4.28 04/09/2023  A/P:stable- continue current medicines    # Hyperglycemia-fasting sugars low 100s but last A1c 2016 5.2 S:  Medication: none Lab Results  Component Value Date   HGBA1C 8.0 11/15/2022   HGBA1C 5.2 07/22/2014   HGBA1C 5.3 06/02/2013  A/P: we think the a1c of 8 was erroneous entry but with fasting sugar 110 will have him back for a1c  #Ocular migraine-diagnosed by ophthalmology March 2021 severe floater x2 at that time- no recent issues   # Peyronie's disease-works with [facility] and had plication with Dr. Gillian Shields which reduced severity and also trialing Cialis 5 mg daily  #left sided Meniere's disease- has seen Dr. Pollyann Kennedy seen in fall.  Sparing meclizine- about 2 episodes a year- doing better - can refill if needed  Recommended follow up: Return in about 1 year (around 04/16/2024) for physical or sooner if needed.Schedule b4 you leave.  Lab/Order associations:NOT fasting   ICD-10-CM   1. Preventative health care  Z00.00     2. Essential hypertension  I10     3. Hyperlipidemia, unspecified hyperlipidemia type  E78.5     4. Hypothyroidism, unspecified type  E03.9     5. Hyperglycemia  R73.9 HgB A1c    6. Screening for diabetes mellitus  Z13.1 HgB A1c    7. Screening for prostate cancer  Z12.5 PSA, Medicare      Meds ordered this encounter  Medications   meclizine (ANTIVERT) 25 MG tablet    Sig: Take 1 tablet (25 mg total) by mouth 3 (three) times daily as needed (meniere's related vertigo).    Dispense:  90 tablet    Refill:  1    Return precautions advised.  Tana Conch, MD

## 2023-04-18 NOTE — Progress Notes (Signed)
Order(s) created erroneously. Erroneous order ID: [id number]  Order moved by: CHART CORRECTION ANALYST SEVEN, IDENTITY  Order move date/time: 04/18/2023 4:37 PM  Source Patient: [id number]  Source Contact: 04/17/2023  Destination Patient: [id number]  Destination Contact: 01/05/2021

## 2023-05-15 ENCOUNTER — Other Ambulatory Visit (INDEPENDENT_AMBULATORY_CARE_PROVIDER_SITE_OTHER): Payer: Medicare Other

## 2023-05-15 ENCOUNTER — Encounter: Payer: Self-pay | Admitting: Family Medicine

## 2023-05-15 DIAGNOSIS — Z131 Encounter for screening for diabetes mellitus: Secondary | ICD-10-CM

## 2023-05-15 DIAGNOSIS — R739 Hyperglycemia, unspecified: Secondary | ICD-10-CM | POA: Diagnosis not present

## 2023-05-15 DIAGNOSIS — Z125 Encounter for screening for malignant neoplasm of prostate: Secondary | ICD-10-CM | POA: Diagnosis not present

## 2023-05-15 LAB — HEMOGLOBIN A1C: Hgb A1c MFr Bld: 5.5 % (ref 4.6–6.5)

## 2023-05-15 LAB — PSA, MEDICARE: PSA: 1.47 ng/mL (ref 0.10–4.00)

## 2023-07-16 ENCOUNTER — Encounter: Payer: Self-pay | Admitting: Family Medicine

## 2023-12-27 ENCOUNTER — Other Ambulatory Visit: Payer: Self-pay | Admitting: Family Medicine

## 2024-03-24 ENCOUNTER — Other Ambulatory Visit: Payer: Self-pay | Admitting: Family Medicine

## 2024-03-24 ENCOUNTER — Encounter: Payer: Self-pay | Admitting: Family Medicine

## 2024-03-24 DIAGNOSIS — Z125 Encounter for screening for malignant neoplasm of prostate: Secondary | ICD-10-CM

## 2024-03-24 DIAGNOSIS — E039 Hypothyroidism, unspecified: Secondary | ICD-10-CM

## 2024-03-24 DIAGNOSIS — Z0000 Encounter for general adult medical examination without abnormal findings: Secondary | ICD-10-CM

## 2024-03-24 DIAGNOSIS — I10 Essential (primary) hypertension: Secondary | ICD-10-CM

## 2024-03-24 DIAGNOSIS — Z131 Encounter for screening for diabetes mellitus: Secondary | ICD-10-CM

## 2024-03-24 DIAGNOSIS — E785 Hyperlipidemia, unspecified: Secondary | ICD-10-CM

## 2024-04-16 ENCOUNTER — Other Ambulatory Visit

## 2024-04-22 ENCOUNTER — Encounter: Payer: Medicare Other | Admitting: Family Medicine
